# Patient Record
Sex: Male | Born: 1945 | Race: Black or African American | Hispanic: No | Marital: Single
Health system: Southern US, Community
[De-identification: ages and names within clinical notes are randomized; demographics above are authoritative.]

## PROBLEM LIST (undated history)

## (undated) ENCOUNTER — Ambulatory Visit
Attending: Rehabilitative and Restorative Service Providers" | Primary: Rehabilitative and Restorative Service Providers"

## (undated) ENCOUNTER — Encounter

## (undated) ENCOUNTER — Telehealth: Attending: Clinical | Primary: Clinical

## (undated) ENCOUNTER — Telehealth: Attending: Registered" | Primary: Registered"

## (undated) ENCOUNTER — Ambulatory Visit
Payer: MEDICARE | Attending: Student in an Organized Health Care Education/Training Program | Primary: Student in an Organized Health Care Education/Training Program

## (undated) ENCOUNTER — Encounter: Attending: Gastroenterology | Primary: Gastroenterology

## (undated) ENCOUNTER — Telehealth

## (undated) ENCOUNTER — Ambulatory Visit: Payer: MEDICARE

## (undated) ENCOUNTER — Telehealth: Attending: Hematology | Primary: Hematology

## (undated) ENCOUNTER — Encounter
Attending: Student in an Organized Health Care Education/Training Program | Primary: Student in an Organized Health Care Education/Training Program

## (undated) ENCOUNTER — Encounter: Payer: MEDICARE | Attending: Registered" | Primary: Registered"

## (undated) ENCOUNTER — Ambulatory Visit

## (undated) ENCOUNTER — Encounter
Payer: MEDICARE | Attending: Student in an Organized Health Care Education/Training Program | Primary: Student in an Organized Health Care Education/Training Program

## (undated) ENCOUNTER — Encounter: Payer: MEDICARE | Attending: Nurse Practitioner | Primary: Nurse Practitioner

## (undated) ENCOUNTER — Ambulatory Visit: Attending: Clinical | Primary: Clinical

## (undated) ENCOUNTER — Telehealth
Attending: Student in an Organized Health Care Education/Training Program | Primary: Student in an Organized Health Care Education/Training Program

## (undated) ENCOUNTER — Ambulatory Visit: Payer: MEDICARE | Attending: Registered" | Primary: Registered"

## (undated) ENCOUNTER — Encounter: Attending: Anesthesiology | Primary: Anesthesiology

## (undated) ENCOUNTER — Encounter: Payer: MEDICARE | Attending: Medical | Primary: Medical

## (undated) ENCOUNTER — Inpatient Hospital Stay

## (undated) ENCOUNTER — Non-Acute Institutional Stay: Payer: MEDICARE

## (undated) ENCOUNTER — Encounter: Payer: MEDICARE | Attending: Otolaryngology | Primary: Otolaryngology

## (undated) MED ORDER — TOBRAMYCIN 300 MG/5 ML IN 0.225 % SODIUM CHLORIDE FOR NEBULIZATION: 300 mg | mL | Freq: Two times a day (BID) | 0 refills | 28 days | Status: CN

---
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
## 2011-08-22 ENCOUNTER — Emergency Department: Payer: Self-pay | Admitting: Emergency Medicine

## 2012-03-30 ENCOUNTER — Inpatient Hospital Stay: Payer: Self-pay | Admitting: Psychiatry

## 2012-03-30 LAB — URINALYSIS, COMPLETE
Bilirubin,UR: NEGATIVE
Blood: NEGATIVE
Glucose,UR: NEGATIVE mg/dL (ref 0–75)
Hyaline Cast: 3
Leukocyte Esterase: NEGATIVE
Nitrite: NEGATIVE
Ph: 5 (ref 4.5–8.0)
Protein: NEGATIVE
RBC,UR: <1 /HPF (ref 0–5)
Specific Gravity: 1.019 (ref 1.003–1.030)
Squamous Epithelial: <1
WBC UR: 1 /HPF (ref 0–5)

## 2012-03-30 LAB — CBC
HCT: 36 % — ABNORMAL LOW (ref 40.0–52.0)
HGB: 11.8 g/dL — ABNORMAL LOW (ref 13.0–18.0)
MCH: 32 pg (ref 26.0–34.0)
MCV: 97 fL (ref 80–100)
RBC: 3.7 10*6/uL — ABNORMAL LOW (ref 4.40–5.90)
RDW: 15.2 % — ABNORMAL HIGH (ref 11.5–14.5)
WBC: 5.2 10*3/uL (ref 3.8–10.6)

## 2012-03-30 LAB — DRUG SCREEN, URINE
Amphetamines, Ur Screen: NEGATIVE (ref ?–1000)
Cannabinoid 50 Ng, Ur ~~LOC~~: NEGATIVE (ref ?–50)
MDMA (Ecstasy)Ur Screen: NEGATIVE (ref ?–500)
Methadone, Ur Screen: NEGATIVE (ref ?–300)
Opiate, Ur Screen: NEGATIVE (ref ?–300)
Phencyclidine (PCP) Ur S: NEGATIVE (ref ?–25)
Tricyclic, Ur Screen: NEGATIVE (ref ?–1000)

## 2012-03-30 LAB — COMPREHENSIVE METABOLIC PANEL
Bilirubin,Total: 0.4 mg/dL (ref 0.2–1.0)
Creatinine: 0.89 mg/dL (ref 0.60–1.30)
EGFR (African American): >60
Glucose: 82 mg/dL (ref 65–99)
Osmolality: 282 (ref 275–301)
Potassium: 3.6 mmol/L (ref 3.5–5.1)
SGOT(AST): 51 U/L — ABNORMAL HIGH (ref 15–37)
SGPT (ALT): 62 U/L
Sodium: 142 mmol/L (ref 136–145)
Total Protein: 8.9 g/dL — ABNORMAL HIGH (ref 6.4–8.2)

## 2013-08-13 ENCOUNTER — Emergency Department: Payer: Self-pay | Admitting: Emergency Medicine

## 2013-08-13 LAB — COMPREHENSIVE METABOLIC PANEL
Albumin: 3.4 g/dL (ref 3.4–5.0)
Alkaline Phosphatase: 79 U/L (ref 50–136)
Anion Gap: 6 — ABNORMAL LOW (ref 7–16)
BUN: 8 mg/dL (ref 7–18)
Calcium, Total: 9.1 mg/dL (ref 8.5–10.1)
Chloride: 104 mmol/L (ref 98–107)
Co2: 30 mmol/L (ref 21–32)
Creatinine: 1.03 mg/dL (ref 0.60–1.30)
EGFR (African American): >60
EGFR (Non-African Amer.): >60
Glucose: 86 mg/dL (ref 65–99)
Osmolality: 277 (ref 275–301)
SGOT(AST): 37 U/L (ref 15–37)
SGPT (ALT): 43 U/L (ref 12–78)
Sodium: 140 mmol/L (ref 136–145)
Total Protein: 9.1 g/dL — ABNORMAL HIGH (ref 6.4–8.2)

## 2013-08-13 LAB — CBC
HCT: 35.9 % — ABNORMAL LOW
HGB: 12 g/dL — ABNORMAL LOW
MCH: 33.1 pg
MCHC: 33.3 g/dL
MCV: 99 fL
Platelet: 353 x10 3/mm 3
RBC: 3.62 x10 6/mm 3 — ABNORMAL LOW
RDW: 13.6 %
WBC: 6 x10 3/mm 3

## 2013-08-13 LAB — DRUG SCREEN, URINE
Barbiturates, Ur Screen: NEGATIVE (ref ?–200)
Cannabinoid 50 Ng, Ur ~~LOC~~: POSITIVE (ref ?–50)
Cocaine Metabolite,Ur ~~LOC~~: POSITIVE (ref ?–300)
MDMA (Ecstasy)Ur Screen: NEGATIVE (ref ?–500)
Opiate, Ur Screen: POSITIVE (ref ?–300)

## 2013-08-13 LAB — TSH: Thyroid Stimulating Horm: 1.56 u[IU]/mL

## 2014-02-17 ENCOUNTER — Emergency Department: Payer: Self-pay | Admitting: Emergency Medicine

## 2014-02-17 LAB — BASIC METABOLIC PANEL
Anion Gap: 2 — ABNORMAL LOW (ref 7–16)
BUN: 11 mg/dL (ref 7–18)
CREATININE: 0.93 mg/dL (ref 0.60–1.30)
Calcium, Total: 8.6 mg/dL (ref 8.5–10.1)
Chloride: 102 mmol/L (ref 98–107)
Co2: 28 mmol/L (ref 21–32)
EGFR (African American): >60
Glucose: 104 mg/dL — ABNORMAL HIGH (ref 65–99)
Osmolality: 264 (ref 275–301)
Potassium: 4 mmol/L (ref 3.5–5.1)
SODIUM: 132 mmol/L — AB (ref 136–145)

## 2014-02-17 LAB — CBC
HCT: 36 % — AB (ref 40.0–52.0)
HGB: 11.7 g/dL — AB (ref 13.0–18.0)
MCH: 31.7 pg (ref 26.0–34.0)
MCHC: 32.6 g/dL (ref 32.0–36.0)
MCV: 97 fL (ref 80–100)
PLATELETS: 327 10*3/uL (ref 150–440)
RBC: 3.69 10*6/uL — ABNORMAL LOW (ref 4.40–5.90)
RDW: 13.9 % (ref 11.5–14.5)
WBC: 9.6 10*3/uL (ref 3.8–10.6)

## 2014-02-17 LAB — TROPONIN I

## 2014-02-18 LAB — HEPATIC FUNCTION PANEL A (ARMC)
Albumin: 2.8 g/dL — ABNORMAL LOW (ref 3.4–5.0)
Alkaline Phosphatase: 74 U/L
BILIRUBIN DIRECT: 0.1 mg/dL (ref 0.00–0.20)
Bilirubin,Total: 0.6 mg/dL (ref 0.2–1.0)
SGOT(AST): 28 U/L (ref 15–37)
SGPT (ALT): 29 U/L (ref 12–78)
Total Protein: 9.6 g/dL — ABNORMAL HIGH (ref 6.4–8.2)

## 2014-02-18 LAB — TROPONIN I: Troponin-I: <0.02 ng/mL

## 2014-02-18 LAB — LIPASE, BLOOD: Lipase: 124 U/L (ref 73–393)

## 2014-06-22 ENCOUNTER — Emergency Department: Payer: Self-pay | Admitting: Emergency Medicine

## 2014-06-22 LAB — COMPREHENSIVE METABOLIC PANEL
ALK PHOS: 67 U/L
ALT: 16 U/L (ref 12–78)
AST: 21 U/L (ref 15–37)
Albumin: 3 g/dL — ABNORMAL LOW (ref 3.4–5.0)
Anion Gap: 3 — ABNORMAL LOW (ref 7–16)
BILIRUBIN TOTAL: 0.3 mg/dL (ref 0.2–1.0)
BUN: 10 mg/dL (ref 7–18)
CO2: 34 mmol/L — AB (ref 21–32)
CREATININE: 1.03 mg/dL (ref 0.60–1.30)
Calcium, Total: 8.3 mg/dL — ABNORMAL LOW (ref 8.5–10.1)
Chloride: 101 mmol/L (ref 98–107)
EGFR (African American): >60
EGFR (Non-African Amer.): >60
Glucose: 63 mg/dL — ABNORMAL LOW (ref 65–99)
OSMOLALITY: 273 (ref 275–301)
POTASSIUM: 3.6 mmol/L (ref 3.5–5.1)
SODIUM: 138 mmol/L (ref 136–145)
TOTAL PROTEIN: 9 g/dL — AB (ref 6.4–8.2)

## 2014-06-22 LAB — DRUG SCREEN, URINE
AMPHETAMINES, UR SCREEN: NEGATIVE (ref ?–1000)
BARBITURATES, UR SCREEN: NEGATIVE (ref ?–200)
BENZODIAZEPINE, UR SCRN: NEGATIVE (ref ?–200)
CANNABINOID 50 NG, UR ~~LOC~~: POSITIVE (ref ?–50)
Cocaine Metabolite,Ur ~~LOC~~: POSITIVE (ref ?–300)
MDMA (Ecstasy)Ur Screen: NEGATIVE (ref ?–500)
METHADONE, UR SCREEN: NEGATIVE (ref ?–300)
Opiate, Ur Screen: POSITIVE (ref ?–300)
Phencyclidine (PCP) Ur S: NEGATIVE (ref ?–25)
Tricyclic, Ur Screen: NEGATIVE (ref ?–1000)

## 2014-06-22 LAB — URINALYSIS, COMPLETE
BLOOD: NEGATIVE
Bacteria: NONE SEEN
Bilirubin,UR: NEGATIVE
GLUCOSE, UR: NEGATIVE mg/dL (ref 0–75)
Ketone: NEGATIVE
Leukocyte Esterase: NEGATIVE
NITRITE: NEGATIVE
PH: 5 (ref 4.5–8.0)
Protein: 30
RBC,UR: 2 /HPF (ref 0–5)
Specific Gravity: 1.027 (ref 1.003–1.030)
Squamous Epithelial: <1

## 2014-06-22 LAB — CBC
HCT: 35.4 % — AB (ref 40.0–52.0)
HGB: 11.2 g/dL — ABNORMAL LOW (ref 13.0–18.0)
MCH: 31.2 pg (ref 26.0–34.0)
MCHC: 31.7 g/dL — ABNORMAL LOW (ref 32.0–36.0)
MCV: 98 fL (ref 80–100)
Platelet: 291 10*3/uL (ref 150–440)
RBC: 3.6 10*6/uL — ABNORMAL LOW (ref 4.40–5.90)
RDW: 14.6 % — ABNORMAL HIGH (ref 11.5–14.5)
WBC: 5.2 10*3/uL (ref 3.8–10.6)

## 2014-06-22 LAB — SALICYLATE LEVEL

## 2014-06-22 LAB — ETHANOL: Ethanol: <3 mg/dL

## 2014-06-22 LAB — ACETAMINOPHEN LEVEL

## 2014-10-11 ENCOUNTER — Inpatient Hospital Stay: Payer: Self-pay | Admitting: Internal Medicine

## 2014-10-11 LAB — COMPREHENSIVE METABOLIC PANEL
ALBUMIN: 2.7 g/dL — AB (ref 3.4–5.0)
ALK PHOS: 67 U/L
AST: 26 U/L (ref 15–37)
Anion Gap: 2 — ABNORMAL LOW (ref 7–16)
BILIRUBIN TOTAL: 0.4 mg/dL (ref 0.2–1.0)
BUN: 8 mg/dL (ref 7–18)
CHLORIDE: 103 mmol/L (ref 98–107)
CREATININE: 1.03 mg/dL (ref 0.60–1.30)
Calcium, Total: 8.1 mg/dL — ABNORMAL LOW (ref 8.5–10.1)
Co2: 34 mmol/L — ABNORMAL HIGH (ref 21–32)
EGFR (African American): >60
Glucose: 88 mg/dL (ref 65–99)
Osmolality: 275 (ref 275–301)
Potassium: 3.5 mmol/L (ref 3.5–5.1)
SGPT (ALT): 21 U/L
SODIUM: 139 mmol/L (ref 136–145)
Total Protein: 9.9 g/dL — ABNORMAL HIGH (ref 6.4–8.2)

## 2014-10-11 LAB — URINALYSIS, COMPLETE
BACTERIA: NONE SEEN
GLUCOSE, UR: NEGATIVE mg/dL (ref 0–75)
Ketone: NEGATIVE
Leukocyte Esterase: NEGATIVE
Nitrite: NEGATIVE
Ph: 8 (ref 4.5–8.0)
Specific Gravity: 1.03 (ref 1.003–1.030)
Squamous Epithelial: <1
WBC UR: 1 /HPF (ref 0–5)

## 2014-10-11 LAB — INFLUENZA A,B,H1N1 - PCR (ARMC)
H1N1 flu by pcr: NOT DETECTED
Influenza A By PCR: NEGATIVE
Influenza B By PCR: NEGATIVE

## 2014-10-11 LAB — ACETAMINOPHEN LEVEL: Acetaminophen: <2 ug/mL

## 2014-10-11 LAB — CBC
HCT: 35.6 % — AB (ref 40.0–52.0)
HGB: 11.2 g/dL — ABNORMAL LOW (ref 13.0–18.0)
MCH: 29.7 pg (ref 26.0–34.0)
MCHC: 31.3 g/dL — ABNORMAL LOW (ref 32.0–36.0)
MCV: 95 fL (ref 80–100)
Platelet: 437 10*3/uL (ref 150–440)
RBC: 3.75 10*6/uL — AB (ref 4.40–5.90)
RDW: 14.5 % (ref 11.5–14.5)
WBC: 5 10*3/uL (ref 3.8–10.6)

## 2014-10-11 LAB — DRUG SCREEN, URINE
AMPHETAMINES, UR SCREEN: NEGATIVE (ref ?–1000)
BARBITURATES, UR SCREEN: NEGATIVE (ref ?–200)
Benzodiazepine, Ur Scrn: NEGATIVE (ref ?–200)
Cannabinoid 50 Ng, Ur ~~LOC~~: POSITIVE (ref ?–50)
Cocaine Metabolite,Ur ~~LOC~~: NEGATIVE (ref ?–300)
MDMA (ECSTASY) UR SCREEN: NEGATIVE (ref ?–500)
METHADONE, UR SCREEN: NEGATIVE (ref ?–300)
OPIATE, UR SCREEN: POSITIVE (ref ?–300)
PHENCYCLIDINE (PCP) UR S: NEGATIVE (ref ?–25)
Tricyclic, Ur Screen: NEGATIVE (ref ?–1000)

## 2014-10-11 LAB — ETHANOL: Ethanol: <3 mg/dL

## 2014-10-11 LAB — SALICYLATE LEVEL

## 2014-10-14 LAB — BASIC METABOLIC PANEL
ANION GAP: 2 — AB (ref 7–16)
BUN: 18 mg/dL (ref 7–18)
CALCIUM: 7.9 mg/dL — AB (ref 8.5–10.1)
CO2: 34 mmol/L — AB (ref 21–32)
Chloride: 106 mmol/L (ref 98–107)
Creatinine: 1.02 mg/dL (ref 0.60–1.30)
EGFR (African American): >60
EGFR (Non-African Amer.): >60
Glucose: 96 mg/dL (ref 65–99)
Osmolality: 285 (ref 275–301)
Potassium: 4.4 mmol/L (ref 3.5–5.1)
Sodium: 142 mmol/L (ref 136–145)

## 2014-10-14 LAB — CBC WITH DIFFERENTIAL/PLATELET
BASOS PCT: 0.5 %
Basophil #: 0 10*3/uL (ref 0.0–0.1)
EOS ABS: 0 10*3/uL (ref 0.0–0.7)
Eosinophil %: 0.2 %
HCT: 32.7 % — AB (ref 40.0–52.0)
HGB: 10.2 g/dL — ABNORMAL LOW (ref 13.0–18.0)
Lymphocyte #: 1.3 10*3/uL (ref 1.0–3.6)
Lymphocyte %: 12.1 %
MCH: 29.9 pg (ref 26.0–34.0)
MCHC: 31.2 g/dL — ABNORMAL LOW (ref 32.0–36.0)
MCV: 96 fL (ref 80–100)
Monocyte #: 0.6 x10 3/mm (ref 0.2–1.0)
Monocyte %: 5.6 %
Neutrophil #: 9 10*3/uL — ABNORMAL HIGH (ref 1.4–6.5)
Neutrophil %: 81.6 %
Platelet: 345 10*3/uL (ref 150–440)
RBC: 3.42 10*6/uL — ABNORMAL LOW (ref 4.40–5.90)
RDW: 14.7 % — ABNORMAL HIGH (ref 11.5–14.5)
WBC: 11 10*3/uL — AB (ref 3.8–10.6)

## 2014-10-15 LAB — BASIC METABOLIC PANEL
Anion Gap: 1 — ABNORMAL LOW (ref 7–16)
BUN: 15 mg/dL (ref 7–18)
Calcium, Total: 7.8 mg/dL — ABNORMAL LOW (ref 8.5–10.1)
Chloride: 103 mmol/L (ref 98–107)
Co2: 37 mmol/L — ABNORMAL HIGH (ref 21–32)
Creatinine: 0.96 mg/dL (ref 0.60–1.30)
EGFR (African American): >60
EGFR (Non-African Amer.): >60
Glucose: 92 mg/dL (ref 65–99)
OSMOLALITY: 282 (ref 275–301)
POTASSIUM: 4.2 mmol/L (ref 3.5–5.1)
Sodium: 141 mmol/L (ref 136–145)

## 2014-10-16 LAB — CULTURE, BLOOD (SINGLE)

## 2014-10-16 LAB — EXPECTORATED SPUTUM ASSESSMENT W REFEX TO RESP CULTURE

## 2014-10-18 LAB — PLATELET COUNT: Platelet: 354 10*3/uL (ref 150–440)

## 2015-01-08 ENCOUNTER — Inpatient Hospital Stay: Payer: Self-pay | Admitting: Internal Medicine

## 2015-01-08 LAB — TROPONIN I: TROPONIN-I: 0.04 ng/mL

## 2015-01-08 LAB — BASIC METABOLIC PANEL
Anion Gap: 7 (ref 7–16)
BUN: 50 mg/dL — AB (ref 7–18)
CALCIUM: 9 mg/dL (ref 8.5–10.1)
Chloride: 97 mmol/L — ABNORMAL LOW (ref 98–107)
Co2: 30 mmol/L (ref 21–32)
Creatinine: 1.53 mg/dL — ABNORMAL HIGH (ref 0.60–1.30)
GFR CALC AF AMER: 59 — AB
GFR CALC NON AF AMER: 49 — AB
GLUCOSE: 240 mg/dL — AB (ref 65–99)
OSMOLALITY: 289 (ref 275–301)
Potassium: 4.3 mmol/L (ref 3.5–5.1)
Sodium: 134 mmol/L — ABNORMAL LOW (ref 136–145)

## 2015-01-08 LAB — CBC
HCT: 36.9 % — ABNORMAL LOW (ref 40.0–52.0)
HGB: 11.7 g/dL — ABNORMAL LOW (ref 13.0–18.0)
MCH: 30.6 pg (ref 26.0–34.0)
MCHC: 31.7 g/dL — ABNORMAL LOW (ref 32.0–36.0)
MCV: 96 fL (ref 80–100)
Platelet: 413 10*3/uL (ref 150–440)
RBC: 3.83 10*6/uL — ABNORMAL LOW (ref 4.40–5.90)
RDW: 16 % — AB (ref 11.5–14.5)
WBC: 15.1 10*3/uL — ABNORMAL HIGH (ref 3.8–10.6)

## 2015-01-08 LAB — HEPATIC FUNCTION PANEL A (ARMC)
ALBUMIN: 2.5 g/dL — AB (ref 3.4–5.0)
Alkaline Phosphatase: 84 U/L (ref 46–116)
Bilirubin, Direct: 0.3 mg/dL — ABNORMAL HIGH (ref 0.0–0.2)
Bilirubin,Total: 0.9 mg/dL (ref 0.2–1.0)
SGOT(AST): 33 U/L (ref 15–37)
SGPT (ALT): 23 U/L (ref 14–63)
Total Protein: 9.5 g/dL — ABNORMAL HIGH (ref 6.4–8.2)

## 2015-01-09 LAB — CBC WITH DIFFERENTIAL/PLATELET
Basophil #: 0 10*3/uL (ref 0.0–0.1)
Basophil %: 0.1 %
Eosinophil #: 0 10*3/uL (ref 0.0–0.7)
Eosinophil %: 0.1 %
HCT: 33.6 % — AB (ref 40.0–52.0)
HGB: 10.8 g/dL — ABNORMAL LOW (ref 13.0–18.0)
LYMPHS ABS: 0.6 10*3/uL — AB (ref 1.0–3.6)
Lymphocyte %: 3.5 %
MCH: 30.7 pg (ref 26.0–34.0)
MCHC: 32 g/dL (ref 32.0–36.0)
MCV: 96 fL (ref 80–100)
Monocyte #: 0.5 x10 3/mm (ref 0.2–1.0)
Monocyte %: 3 %
NEUTROS ABS: 14.7 10*3/uL — AB (ref 1.4–6.5)
Neutrophil %: 93.3 %
Platelet: 372 10*3/uL (ref 150–440)
RBC: 3.5 10*6/uL — ABNORMAL LOW (ref 4.40–5.90)
RDW: 15.6 % — AB (ref 11.5–14.5)
WBC: 15.7 10*3/uL — AB (ref 3.8–10.6)

## 2015-01-10 LAB — CBC WITH DIFFERENTIAL/PLATELET
Basophil #: 0 10*3/uL (ref 0.0–0.1)
Basophil %: 0 %
Eosinophil #: 0 10*3/uL (ref 0.0–0.7)
Eosinophil %: 0 %
HCT: 30.4 % — ABNORMAL LOW (ref 40.0–52.0)
HGB: 9.7 g/dL — ABNORMAL LOW (ref 13.0–18.0)
Lymphocyte #: 0.7 10*3/uL — ABNORMAL LOW (ref 1.0–3.6)
Lymphocyte %: 3.9 %
MCH: 30.8 pg (ref 26.0–34.0)
MCHC: 31.9 g/dL — ABNORMAL LOW (ref 32.0–36.0)
MCV: 97 fL (ref 80–100)
MONOS PCT: 4.4 %
Monocyte #: 0.8 x10 3/mm (ref 0.2–1.0)
NEUTROS PCT: 91.7 %
Neutrophil #: 15.9 10*3/uL — ABNORMAL HIGH (ref 1.4–6.5)
PLATELETS: 328 10*3/uL (ref 150–440)
RBC: 3.15 10*6/uL — AB (ref 4.40–5.90)
RDW: 15.6 % — ABNORMAL HIGH (ref 11.5–14.5)
WBC: 17.3 10*3/uL — AB (ref 3.8–10.6)

## 2015-01-10 LAB — COMPREHENSIVE METABOLIC PANEL
ALBUMIN: 1.8 g/dL — AB (ref 3.4–5.0)
ALT: 17 U/L (ref 14–63)
AST: 23 U/L (ref 15–37)
Alkaline Phosphatase: 61 U/L (ref 46–116)
Anion Gap: 9 (ref 7–16)
BUN: 22 mg/dL — ABNORMAL HIGH (ref 7–18)
Bilirubin,Total: 0.5 mg/dL (ref 0.2–1.0)
CALCIUM: 7.7 mg/dL — AB (ref 8.5–10.1)
CO2: 25 mmol/L (ref 21–32)
Chloride: 106 mmol/L (ref 98–107)
Creatinine: 0.94 mg/dL (ref 0.60–1.30)
EGFR (African American): >60
Glucose: 131 mg/dL — ABNORMAL HIGH (ref 65–99)
Osmolality: 285 (ref 275–301)
Potassium: 3.7 mmol/L (ref 3.5–5.1)
SODIUM: 140 mmol/L (ref 136–145)
Total Protein: 7 g/dL (ref 6.4–8.2)

## 2015-01-10 LAB — CULTURE, BLOOD (SINGLE)

## 2015-01-10 LAB — CLOSTRIDIUM DIFFICILE(ARMC)

## 2015-01-11 LAB — EXPECTORATED SPUTUM ASSESSMENT W GRAM STAIN, RFLX TO RESP C

## 2015-01-13 ENCOUNTER — Emergency Department: Payer: Self-pay | Admitting: Emergency Medicine

## 2015-01-19 ENCOUNTER — Inpatient Hospital Stay: Payer: Self-pay | Admitting: Psychiatry

## 2015-03-26 NOTE — Consult Note (Signed)
PATIENT NAME:  Zachary Hurley, Zachary Hurley MR#:  [id number] DATE OF BIRTH:  1946-11-12  DATE OF CONSULTATION:  08/14/2013 DATE OF ADMISSION: 08/13/2013.  CONSULTING PHYSICIAN:  Dezerae Freiberger S. Garnetta Buddy, MD  REQUESTING PHYSICIAN: Dr. Cyril Loosen.  REASON FOR CONSULTATION: "I'm just tired."   HISTORY OF PRESENT ILLNESS: The patient is a 69 year old African American male who presented to the ED requesting detox from heroin. He reported that he has been using heroin off and on for 30 to 40 years. He reported that his last use was approximately 3:00 pm yesterday when  he used 4 to 5 bags. The patient stated that he has been consuming $40 to $50 worth  of heroin and has been just tired of using it. He stated that he is not feeling suicidal or homicidal. He stated that he is not hearing any voices or having any thoughts to hurt himself. He sometimes feels depressed and wants help with his heroin use. The patient reported that he has been spending most of the time in the bed with lack of energy, anhedonia, feeling hopeless, and now he needs help with his heroin use. The patient reported that currently he is experiencing withdrawal symptoms, feeling achy, jittery with poor sleep and appetite is fair. The patient stated that he wants to go to drug rehab program so he can quit using heroin at this time.   PAST PSYCHIATRIC HISTORY: The patient reported that he started using heroin when he was in his 54's.  His drug of choice is for heroin. He reported that he has also used pills including Percocet and a lot of downers in the past. He stated that he has been to several other hospitals in the past including [facility],  [facility], as well as [facility]. He stated that he remained sober for 2 to 3 years, but then relapsed again, but does not know the reason for the relapse. He stated that he follows with his family care physician, Dr. Maryellen Pile. He does not have any history of previous suicide attempts in the past.   PAST MEDICAL HISTORY: The patient  reported that he has been diagnosed with HIV and has been following with [facility]. He is compliant with his medication. He reported that his labs are undetectable at this time.   CURRENT HOME MEDICATIONS: Kaletra 200/50 mg 2 tablets b.i.d., Truvada 200/300 mg 1 tablet once a day.   ALLERGIES: No known drug allergies.   FAMILY HISTORY: The patient denies any history of drug problems.   SOCIAL HISTORY: The patient reported that he is currently getting SSI and supports himself on the same. He reported that he has been in the prison for at least 15 to 20 times in the past. He used to sell cocaine to support himself. He is not on probation at this time. He reported that he was in the prison last time 9 years ago. He is not on probation at this time. The patient stated that  he is currently living with his sister. He has never been married but has two sons and has a good  relationship with them.   PHYSICAL EXAMINATION:  VITAL SIGNS: Temperature 98.1, pulse 100, respirations 18, blood pressure 128/65.   LABORATORY DATA:  Glucose 86, BUN 8, creatinine 1.03, sodium 140, potassium 3.5 , chloride 104, bicarbonate 30, anion gap 6, osmolality 277, calcium 9.1. Blood alcohol less than 3. Protein 9.1, bilirubin 0.4, alkaline phosphatase 99, AST 37, ALT 43, TSH 1.56. UDS positive for cocaine, cannabinoids and opioids. WBC  6.0, RBC 3.62, hemoglobin 12, hematocrit 35.9., MCV 99.    REVIEW OF SYSTEMS:  CONSTITUTIONAL: Denies any fever, weakness, having some fatigue.  EYES: No blurred or double vision.  ENT: Denies any tinnitus, ear pain, hearing loss.  RESPIRATORY: No cough, wheezing or hemoptysis noted.  CARDIOVASCULAR: Denies any chest pain or orthopnea, edema or arrhythmias. GASTROINTESTINAL: Having some nausea and abdominal pain.  GENITOURINARY: No dysuria or hematuria noted.  HEMATOLOGY: No anemia or easy bruising noted.  INTEGUMENTARY: No acne or rash noted.  MUSCULOSKELETAL: Having some pain in  the body and some cramps present.  NEUROLOGICAL: Denies any numbness weakness at this time.   MENTAL STATUS EXAMINATION: The patient is a thinly built male who was lying in the bed. He maintained fair eye contact. His speech was low in tone and volume. Mood was depressed. Affect was congruent. Thought process was logical, goal-directed. Thought content was nondelusional. He currently denied having any suicidal or homicidal ideations or plans. He is currently experiencing withdrawal from the drugs. He agreed with the plan to go to [facility]. He demonstrated fair insight and judgment regarding his drug use. He denied having any perceptual disturbances.   DIAGNOSTIC IMPRESSION: AXIS I: 1. Polysubstance dependence.  2. Opioid withdrawal.  AXIS II: None.  AXIS III: Human immunodeficiency virus.   TREATMENT PLAN: The patient has a bed at [facility] and he will be transferred there shortly. He currently denied having any suicidal ideation or plans.   Thank you for allowing me to participate in the care of this patient.   ____________________________ Ardeen FillersUzma S. Garnetta BuddyFaheem, MD usf:sg D: 08/14/2013 11:12:00 ET T: 08/14/2013 12:00:24 ET JOB#: [id number]  cc: Ardeen FillersUzma S. Garnetta BuddyFaheem, MD, <Dictator> Rhunette CroftUZMA S Edilia Ghuman MD ELECTRONICALLY SIGNED 08/26/2013 8:59

## 2015-03-27 NOTE — H&P (Signed)
PATIENT NAME:  Zachary Hurley, Zachary Hurley MR#:  [id number] DATE OF BIRTH:  22-Feb-1946  DATE OF ADMISSION:  10/11/2014  PRIMARY CARE PHYSICIAN: None.  INFECTIOUS DISEASE: The patient was going to [facility].   CHIEF COMPLAINT: Hemoptysis.   HISTORY OF PRESENT ILLNESS: The patient to the ER. thinks he has pneumonia and has suicidal ideation along with symptoms of hemoptysis. [name] is a 69 year old African American gentleman with long-standing history of HIV, IV heroine dependence along with polysubstance street drug use who comes to the Emergency Room with complaints of hemoptysis. The patient says he has been noticing streaks of blood in his phlegm for the last few days. Denies any fever, has poor appetite and some sweats at nighttime. He is basically homeless at this time, does not have any place, however, tries to go from one shelter home to another. In the Emergency Room, he has hemodynamically stable; however, his chest x-ray shows worrisome for increased interstitial opacities, left greater than the right; hence, he is being admitted for possible pneumonia in the setting of HIV. He received IV Rocephin and Levaquin. The patient denies any history of tuberculosis or any exposure to TB; however, being homeless he is going to be placed in isolation and AFB sputum has been attempted to be collected.   The patient has long-standing history of heroin dependence. He uses about 3-4 bags of IV heroin on a daily basis. His last use was this morning. He has been at [facility] and [facility] for his heroin dependence; however, he relapses and gets back to his substance abuse.   PAST MEDICAL HISTORY:  1.  HIV.  2.  IV heroine dependence.   SOCIAL HISTORY: He has been on and off in the prison for at least 15-20 times because of his abuse. He is never married. Denies any history of smoking or alcohol use; however, he does use cocaine and mainly heroin on a daily basis.   ALLERGIES: No known drug allergies.   FAMILY  HISTORY: Positive for heart problems in distant family relatives. He does not know much about his family history.   REVIEW OF SYSTEMS: CONSTITUTIONAL: No fever. Positive for fatigue, weakness.  EYES: No blurred or double vision. No cataracts.  EARS, NOSE AND THROAT: No tinnitus, ear pain or postnasal drip.  RESPIRATORY: Positive for hemoptysis and dyspnea.  CARDIOVASCULAR: No chest pain, orthopnea, edema or hypertension.  GASTROINTESTINAL: No nausea, vomiting, diarrhea, abdominal pain or hematemesis.  GENITOURINARY: No dysuria, hematuria, frequency.  ENDOCRINE: No polyuria, nocturia or thyroid problems.  HEMATOLOGY: No anemia or easy bruising.  SKIN: No acne, rash.  MUSCULOSKELETAL: Positive for back pain. No arthritis, swelling or gout.  NEUROLOGIC: No CVA, TIA or seizure.  PSYCHIATRIC: Positive for feeling depressed. No anxiety or bipolar. All other systems reviewed and negative.   PHYSICAL EXAMINATION: GENERAL: The patient is thin, cachectic, malnourished.  VITAL SIGNS: Afebrile, pulse is 58, blood pressure is 142/93, pulse oximetry is 99.  HEENT: Atraumatic, normocephalic. PERRLA. EOM intact. Oral mucosa is dry.  NECK: Supple. No JVD. No carotid bruit.  LUNGS: There are a few crackles heard. No respiratory distress or labored breathing. Saturations are more than 97% on room air.  CARDIOVASCULAR: Both the heart sounds are normal. No murmur heard. PMI not lateralized. Chest nontender.  EXTREMITIES: Good pedal pulses, good femoral pulses. No lower extremity edema.  ABDOMEN: Soft, benign, scaphoid abdomen. No tenderness. No organomegaly.  SKIN: Warm and dry. No lesions seen.  NEUROLOGIC: Appears grossly intact, cranial nerves II-XII,  no motor or sensory deficit.  PSYCHIATRIC: The patient is awake, alert, oriented x 3.   LABORATORY AND RADIOLOGICAL DATA: Chest x-ray shows worsening bibasilar interstitial opacities, left greater than the right, possibly progressive atelectasis or scar,  though worrisome for multifocal infection superimposed on advanced emphysematous changes. Tricyclic antidepressant negative in the urine. White count is 5.0, H and H is 11.2 and 35.6, platelet count is 437,000. Comprehensive metabolic panel is within normal limits except CO2 of 34. Albumin is 2.7. Serum salicylate, serum methanol and serum acetaminophen levels within normal limits.   ASSESSMENT: A 69 year old Kennis CarinaMarion Lyttle with history of HIV and intravenous drug dependence, comes in with:  1.  Hemoptysis in the setting of HIV along with emphysema, emphysematous lung changes. The patient is going to be admitted in the isolation room. He will be continued on Levaquin. We will add Bactrim for now for pneumocystis pneumonia/pneumonitis. I will have pulmonary and infectious disease see the patient. AFB sputum to be collected along with sputum culture. Follow blood cultures. The patient's white count is normal. Further workup per ID and pulmonary consultation. Watch for massive hemoptysis.  2.  Intravenous heroin drug dependence along with history of cocaine drug abuse in the past. The patient normally steals drugs off the street and injects on a daily basis 3-4 bags of IV heroine. His last use was this morning. I will have psychiatry see the patient in the morning to determine treatment plan if any at this time. He has been referred to [facility] and [facility] in the past by psychiatry during his visits to the Emergency Room. Watch for heroin withdrawal.  3.  HIV. The patient has been off medications due to compliance issues. [facility]. He was on Truvada and Kaletra. I will have Dr. Sampson GoonFitzgerald look into it and determine which anti-HIV medications he needs to be on.  4.  Deep venous thrombosis prophylaxis. Subcutaneous heparin.  5.  Moderate to severe protein/calorie malnutrition secondary to chronic illness from HIV, poor p.o. intake and social living situation. We will have dietician see the  patient in consultation.  Further workup according to the patient's clinical course. Hospital admission plan was discussed with the patient, no family members present.   TIME SPENT: Fifty minutes.    ____________________________ Wylie HailSona A. Allena KatzPatel, MD sap:TT D: 10/11/2014 16:48:16 ET T: 10/11/2014 17:05:13 ET JOB#: [id number]  cc: Erienne Spelman A. Allena KatzPatel, MD, <Dictator> Willow OraSONA A Ahmiya Abee MD ELECTRONICALLY SIGNED 11/05/2014 14:05

## 2015-03-27 NOTE — Discharge Summary (Signed)
PATIENT NAME:  Zachary Hurley, Zachary Hurley MR#:  [id number] DATE OF BIRTH:  March 11, 1946  DATE OF ADMISSION:  10/11/2014 DATE OF DISCHARGE:  10/18/2014  ADMITTING PHYSICIAN: Sona A. Allena Katz, MD.  DISCHARGING PHYSICIAN: Enid Baas, MD.  PRIMARY CARE PHYSICIAN: [facility].  CONSULTATIONS IN THE HOSPITAL:  1. Pulmonary-critical care consultation with Dr. Carolynne Edouard, MD. 2. Infectious disease consultation with Stann Mainland. Sampson Goon, MD. 3. Psychiatric consultation with Audery Amel, MD.  DISCHARGE DIAGNOSES:  1. Haemophilus influenza pneumonia. 2. Human immunodeficiency virus with CD4 count of 491. 3. Chronic bronchiectasis. 4. Depression. 5. Steroid dependence.   DISCHARGE HOME MEDICATIONS:  1. Truvada 200/300 mg 1 tablet p.o. q. daily. 2. Kaletra 200/50 mg 2 tablets orally twice a day. 3. Amoxicillin 500 mg p.o. q. 8 hours for 8 days.  4. Ciprofloxacin 500 mg p.o. b.i.d. for 8 more days.  5. Albuterol inhaler 2 puffs q. 6 hours. p.r.n. for wheezing.  DISCHARGE DIET: Regular diet.  DISCHARGE ACTIVITY: As tolerated.  FOLLOWUP INSTRUCTIONS: Follow up with [facility] in 1 week.   LABORATORY AND IMAGING STUDIES PRIOR TO DISCHARGE: Sodium 141, potassium 4.2, chloride 103, bicarbonate 37, BUN 15, creatinine 0.96, glucose 92, and calcium of 7.8. WBC 11, hemoglobin 10.2, hematocrit 32.7, platelet count 345,000. HIV RNA is undetectable at this time, less than 20 copies per milliliter. AFB x 3 is negative. CD4 count is 496. Sputum culture is growing heavy growth Haemophilus influenza. CT of the chest done on admission with contrast showing lower zone predominant bronchiectasis and peri-bronchovascular airspace disease. Could be infectious process, atypical or fungal infection. Coronary artery calcifications. Tiny bilateral pleural effusions and bilateral adrenal lesions noted. Influenza test is negative. Blood cultures negative on admission. Urine toxicology screen positive for  marijuana and also opioids, and admission urinalysis negative for infectious. ALT 21, AST 26, alkaline phosphatase 67, total bilirubin 0.4 and albumin of 2.7 noted.  BRIEF HOSPITAL COURSE: Mr. Uphoff is a 69 year old African-American male with a past medical history significant for HIV infection noncompliant with his medications and heroin dependence, who presents to the hospital secondary to coughing, difficulty breathing, and also hemoptysis.  1. Haemophilus influenza pneumonia, initially admitted and was placed on isolation because of his underlying HIV. Also looking at the CT chest, they want to rule out AFB. His sputum x 3 were negative for acid-fast bacilli stains, so taken out of droplet isolation. There was a concern for Pneumocystis as well as opportunistic infection. However, CD4 count was greater than 496, so less likely to be Pneumocystis and his sputum cultures were growing H. influenza. He was initially started on broad-spectrum antibiotics. Once sputum cultures were available, he is being changed over to oral amoxicillin and ciprofloxacin for a total of 10 days. During initial admission process, he was also seen by pulmonologist, Dr. Welton Flakes, to evaluate if the patient needs any bronchoscopy for Pneumocystis diagnosis. Since he was improving with antibiotics and sputum cultures were positive for H. influenza, no further need for bronchoscopy at this time. The patient has underlying chronic bronchiectasis. He has improved significantly while in the hospital and is being discharged home. 2.  HIV. CD4 count is 496. HIV RNA was undetectable. The patient was on Kaletra and Truvada in the past but has been noncompliant recently. He was restarted back on that and is strongly recommended to follow up with [facility] for the same.  3. Episode of unconsciousness in the hospital. Not sure if his friends brought anymore heroin in  the hospital. He is extremely heroin dependent. He usually takes about 4 to 5  bags of heroin as an outpatient . Started on methadone withdrawal and has finished withdrawal in the hospital. After the unresponsive episode, all the opioids were stopped and he responded to Narcan and was monitored in ICU for 1 days. Did not require any intubation at that point. He is more alert and back to his baseline at this time. He is strongly counseled against using heroin again.  His course has been otherwise uneventful in the hospital.   DISCHARGE CONDITION: Stable.   DISCHARGE DISPOSITION: Home.  TIME SPENT ON DISCHARGE: 40 minutes.   ____________________________ Enid Baasadhika Adonis Yim, MD rk:jh D: 10/18/2014 10:49:31 ET T: 10/18/2014 17:28:39 ET JOB#: [id number]  cc: Enid Baasadhika Amaree Leeper, MD, <Dictator> Enid BaasADHIKA Shuree Brossart MD ELECTRONICALLY SIGNED 10/24/2014 15:05

## 2015-03-27 NOTE — Consult Note (Signed)
PATIENT NAME:  Zachary Hurley, Zachary Hurley MR#:  [id number] DATE OF BIRTH:  04-08-1946  DATE OF CONSULTATION:  10/12/2014  REFERRING PHYSICIAN:   CONSULTING PHYSICIAN:  Stann Mainlandavid P. Sampson GoonFitzgerald, MD  REQUESTING PHYSICIAN:  Dr. Nemiah CommanderKalisetti.     REASON FOR CONSULTATION: Hemoptysis in an HIV patient.   HISTORY OF PRESENT ILLNESS: This is a 69 year old gentleman with long-standing HIV as well as polysubstance abuse, homelessness, and psychiatric history who was admitted November 8 with hemoptysis. He had also been having suicidal ideation. He reports now he has been coughing for several weeks and has had some streaks of blood in it for the last few days. He denies any fevers or night sweats. He is very thin, but reports he has always been like that and has not lost any weight.    The patient had a chest x-ray and CT showing probable pneumonia so he was admitted. The patient reports he is not 100% compliant with his HIV medications. [facility] but has not been seen in some months. He cannot name his antiretrovirals. He does continue to use heroin. He had been a heavy smoker but is no longer smoking.   PAST MEDICAL HISTORY:  1.  HIV.  2.  Heroin dependence.  3.  COPD.    SOCIAL HISTORY: He is single. He quit smoking, but did used to smoke heavily. Denies alcohol. Does use cocaine and heroin on a daily basis. He has been in prison several times.   ALLERGIES: No known drug allergies.   FAMILY HISTORY: Noncontributory.   REVIEW OF SYSTEMS:  11 systems reviewed and negative except as per HPI.   PHYSICAL EXAMINATION:  VITAL SIGNS: Temperature 98.4, pulse 60, blood pressure 115/69, respirations 18, saturation 98% on room air.  GENERAL: He is quite thin. No respiratory distress.  HEENT: Pupils equal, round, reactive to light and accommodation. Extraocular movements are intact. Sclerae are anicteric.  OROPHARYNX: Clear with no thrush.  NECK: Supple. He has no anterior cervical, posterior cervical, or  supraclavicular lymphadenopathy.  HEART: Regular.  LUNGS: Have decreased breath sounds bilaterally.  ABDOMEN: Soft and distended. No hepatosplenomegaly.  EXTREMITIES: No clubbing, cyanosis, or edema.  NEUROLOGIC: He is alert and oriented x 3. Grossly nonfocal neurologic exam.   DIAGNOSTIC DATA: White count 5.0, hemoglobin 11.2, platelets 437,000. Renal function is normal. LFTs show an elevated total protein 9.9, albumin low at 2.7, otherwise LFTs normal. Ethanol level negative. Toxicology screen positive for opiates and marijuana. Blood cultures x 2 negative. Sputum culture being held for possible pathogen. Influenza PCR negative. CT of the chest shows no pathologically enlarged lymph nodes. There are tiny bilateral effusions, there is moderate to severe central lobular emphysema with minimal bullous changes in the apices. There is bronchiectasis in the right middle lobe, lingula, and both lower lobes with associated mucoid impaction and perivascular airspace opacity, debris was seen in the left main stem bronchus. There is a 6 mm low attenuating lesion in the right hepatic lobe and in the kidneys bilaterally. CD4 count is pending.   IMPRESSION: A 69 year old gentleman with long-standing HIV and heroin use, admitted with cough for several weeks with hemoptysis. Sputum culture is pending. He is not very compliant with his HIV meds and his CD4 count is pending.   RECOMMENDATIONS:  1.  Check sputum x 3 and continue isolation until negative.  2.  Continue ceftriaxone and levofloxacin for now.  3.  Continue Bactrim prophylactic dosing.  4.  I will call [facility] to find out  what his most recent CD4 viral loads were and what his antiretroviral regimen is.  5.  Thank you for the consult. I will be glad to follow with you.     ____________________________ Stann Mainland. Sampson Goon, MD dpf:bu D: 10/12/2014 14:15:05 ET T: 10/12/2014 14:29:49 ET JOB#: [id number]  cc: Stann Mainland. Sampson Goon, MD, <Dictator> DAVID  Sampson Goon MD ELECTRONICALLY SIGNED 10/13/2014 17:38

## 2015-03-27 NOTE — Consult Note (Signed)
Brief Consult Note: Diagnosis: opiate abuse severe.   Patient was seen by consultant.   Consult note dictated.   Orders entered.   Comments: Psychiatry: PAtient seen and chart reviewed and note dictated. Patient with opiate abuse severe. Denies any wish to harm or kill himself. Agrees to outpt referral. Commitment discontinued.  Electronic Signatures: Audery Amellapacs, John T (MD)  (Signed [id number]-Nov-15 15:13)  Authored: Brief Consult Note   Last Updated: 09-Nov-15 15:13 by Audery Amellapacs, John T (MD)

## 2015-03-27 NOTE — Consult Note (Signed)
Psychiatry: PAtient seen. Chart reviewed. PAtient reports he is feeling better. Mood still a little down but better. Denies any suicidal ideation. Affect euthymic. Speach normal in rate and tone. Thoughts lucid. Energy seems improved. Discussed treatment of depression. PAtient feels something to help sleep at night would help. He has never really stayed on medication for depression long enough before to know if it works. Will add remeron 15mg  at night for sleep, mood and depression. Patient agreeable.  Electronic Signatures: Velda Wendt, Jackquline DenmarkJohn T (MD)  (Signed on 10-Nov-15 21:43)  Authored  Last Updated: 10-Nov-15 21:43 by Audery Amellapacs, Arlean Thies T (MD)

## 2015-03-27 NOTE — Consult Note (Signed)
**Note Zachary Hurley-Identified via Obfuscation** PATIENT NAME:  Zachary Hurley, Zachary Hurley MR#:  [id number] DATE OF BIRTH:  February 25, 1946  DATE OF CONSULTATION:  10/12/2014  CONSULTING PHYSICIAN:  Gonzella Lex, MD  IDENTIFYING INFORMATION AND REASON FOR CONSULTATION:  This is a 69 year old man with a history of opiate abuse, who came in to the hospital with pneumonia and suicidal ideation, consult for suicidal ideation.   HISTORY OF PRESENT ILLNESS:  Information is obtained from the patient and the chart. He came in to the hospital coughing, feeling sick, but also made some comments about suicidality. On interview today, the patient tells me that he has been feeling bad for a couple of weeks. He had relapsed back into heroin use and has been using intravenous heroin probably for months. He has no current stable place to stay. He just stays from place to place where he can. His mood stays down. He gets down on himself a lot. He feels a lot of regrets. He sleeps poorly at night. Appetite has been poor, and he has been losing weight. He is generally noncompliant with recommended treatment of his HIV disease. He denies that he is drinking. He says that he uses a little marijuana occasionally, but heroin is by far the most important drug to him. He denies that he has made any suicide attempts.   PAST PSYCHIATRIC HISTORY:  The patient has a history of getting down on himself and being depressed. At times, he has been prescribed antidepressants previously. In the past, he has not found antidepressants to really be helpful. Mood is good if he can get his life stable and stay off of substances. He has been to [facility] in the past. He is familiar with substance abuse treatment resources.   SOCIAL HISTORY:  He apparently is estranged from most of his family. He says that there is a woman who he can go back to live with, but seems to have a pretty impaired social setting.   FAMILY HISTORY:  Denies any family history.   PAST MEDICAL HISTORY:  He has HIV and tends to be fairly  uncooperative with his medication. Currently in the hospital with hemoptysis, unclear if it could be AF[employer].   CURRENT MEDICATIONS:  On admission, it seems like he was probably not taking anything.   ALLERGIES:  No known drug allergies.   REVIEW OF SYSTEMS:  Mildly down on himself. Mildly depressed. Weak. Lungs are starting to feel better, breathing better. No suicidal ideation. No hallucinations.   MENTAL STATUS EXAMINATION:  Chronically ill gentleman who looks his stated age, cooperative with the interview. Eye contact is good. Psychomotor activity is a little bit slow. Speech is a little bit slow, but easy to understand. Affect is constricted. Mood is stated as being okay. Thoughts are lucid without any loosening of associations or evidence of delusions. Denies auditory or visual hallucinations. Denies suicidal or homicidal ideation. He can recall 3 out of 3 objects immediately, only 1 out of 3 at 3 minutes and complains of chronic memory problems. Alert and oriented x 4. Normal intelligence.   ASSESSMENT:  A 69 year old man with opiate dependence. He is not acutely suicidal. Commitment criteria no longer met. He can be taken off commitment. He can be discharged when medically ready. We will encourage him to follow up with outpatient substance abuse treatment. No indication at this point for changing any medication.   DIAGNOSIS, PRINCIPAL AND PRIMARY:  AXIS I:  Opiate abuse, severe.   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   AXIS II:  Deferred.   AXIS III:  HIV disease.   ____________________________ Gonzella Lex, MD jtc:nb D: 10/12/2014 22:01:41 ET T: 10/12/2014 22:18:09 ET JOB#: [id number]  cc: Gonzella Lex, MD, <Dictator> Gonzella Lex MD ELECTRONICALLY SIGNED 10/19/2014 17:04

## 2015-03-27 NOTE — Consult Note (Signed)
PATIENT NAME:  Zachary Hurley, EGGENBERGER MR#:  [id number] DATE OF BIRTH:  1946-11-18  DATE OF CONSULTATION:  06/23/2014  REFERRING PHYSICIAN:  Dr. Jene Every. CONSULTING PHYSICIAN:  Tavarius Grewe S. Garnetta Buddy, MD  REASON FOR CONSULTATION: " I want detox from heroin and pills."   HISTORY OF PRESENT ILLNESS: The patient is a 69 year old African American male with long history of substance dependence, who presented to the ER as he was requesting detoxification from heroin, pills, and cocaine. He reported that he wants detoxification and wants to stop using drugs. He denied having any suicidal ideations or plans. He reported that he is not having any perceptual disturbances.   During my interview, the patient was lying in the bed. He reported that he has been using opioids, heroin, Percocet, and methadone. He reported that he cannot do like this anymore. He stated that he has been using 3 bags of heroin and used them yesterday. When he cannot get dope, then he started using pills. He has been using 3-4 pills of Percocet, as well as 2-3 pills of methadone. He has been doing it off and on for the last 40 years. Last time when he came to the hospital, he was sent to [facility] and he completed a 21 day program. He reported that he was able to maintain sobriety; however, now he wants to go to [facility], as he was there 20 years ago. He reported that he does not have any thoughts to hurt himself; however, he occasionally feels depressed. He currently denied having any auditory or visual hallucinations. He denied having any thoughts to hurt himself.   PAST PSYCHIATRIC HISTORY: The patient has long history of using drugs, including opioids. He reported that he has used cocaine in the past. He stated that he was admitted to the [facility] in the past. He has never tried to hurt himself.   PAST MEDICAL HISTORY: The patient is HIV-positive and takes medication for the same. He stated that he is compliant with his medication and will follow up at  the [facility] on a regular basis. He stated that he is undetectable at this time.   SOCIAL HISTORY: The patient reported that he lives between his sister and the streets. He came from the street at this time and called for help as he was tired of living this life.   CURRENT MEDICATIONS: Kaletra 200/50 mg 2 tablets b.i.d. and Truvada 200/300 mg 1 tablet once daily.   ALLERGIES: NO KNOWN DRUG ALLERGIES.   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  He is also being supported by the checked from [employer].   VITAL SIGNS: Temperature 96, pulse 75, respirations 18, blood pressure 100/59.   REVIEW OF SYSTEMS:  CONSTITUTIONAL: No fever or chills.  The patient is a cachectic-appearing male lying in the bed.  EYES: No double or blurred vision.  RESPIRATORY: No shortness of breath or cough.  CARDIOVASCULAR: No chest pain or orthopnea.  GASTROINTESTINAL: No abdominal pain, nausea, vomiting, or diarrhea.  GENITOURINARY: No incontinence or bleeding.  ENDOCRINE: No heat or cold intolerance.  LYMPHATIC: No anemia or easy bruising.  MUSCULOSKELETAL: No muscle or joint pain.  NEUROLOGIC: No tingling or weakness.  LABORATORY DATA: Glucose 63, BUN 10, creatinine 1.03, sodium 138, potassium 2.6, chloride 101, bicarbonate 34, anion gap 3, osmolality 273, calcium 8.3. Blood alcohol level less than 3. Protein 9.0, albumin 3, bilirubin 0.3, alkaline phosphatase 67, AST 21, ALT 16. UDS positive for cocaine, cannabinoids, and opioids.  RBC 3.6, hemoglobin 11.2, hematocrit 35.4, RDW is 14.6.   MENTAL STATUS EXAMINATION:  General appearance, the patient is a cachectic-appearing  male who was lying still in the bed. He appears malnourished.  His gait and station was not tested as he was lying still in the bed. His speech was low in tone and volume. Mood was depressed. Affect was blunted. Thought process was logical, goal-directed. Thought content was non-delusional. No loose associations are  noted. He currently denied having any suicidal or homicidal ideations or plans. His insight and judgment were poor regarding his use of the opioids at this time. He was awake, alert, and oriented x 3. His recent and remote memory was intact. His attention span and concentration were normal. His language and fund of knowledge were appropriate.   DIAGNOSTIC IMPRESSION:  AXIS I:  1.  Opioid dependence.  2.  Opioid-induced mood disorder.  AXIS II: None.  AXIS III: Human immunodeficiency virus-positive.   TREATMENT PLAN: Discussed with the patient at length about use of drugs and advised him to be admitted to the [facility] again to get help about the same. However, he is resistant and does not want to go to the [facility] as reported.  It is too far for him. He stated that he wants to go to [facility]. Stated that he will be referred to the behavioral health unit and they will discuss with him about the same. The patient will be started on his HIV medications at this time.   Thank you for allowing me to participate in the care of this patient.    ____________________________ Ardeen FillersUzma S. Garnetta BuddyFaheem, MD usf:ts D: 06/23/2014 16:15:06 ET T: 06/23/2014 16:43:53 ET JOB#: [id number]  cc: Ardeen FillersUzma S. Garnetta BuddyFaheem, MD, <Dictator> Rhunette CroftUZMA S Elwyn Klosinski MD ELECTRONICALLY SIGNED 07/02/2014 21:22

## 2015-03-28 NOTE — H&P (Signed)
PATIENT NAME:  Zachary CarinaWARREN, Samier MR#:  [id number] DATE OF BIRTH:  [birth year]  DATE OF ADMISSION:  03/30/2012  INITIAL ASSESSMENT AND PSYCHIATRIC EVALUATION  IDENTIFYING INFORMATION: The patient is a 69 year old African American male not employed and last worked many years ago. The patient is single, never married and has been living here and there and most of the time with this two sisters. The patient comes as inpatient hospitalization in psychiatry at [facility] with the chief complaint, "I need to get off my addiction with heroin".  HISTORY OF PRESENT ILLNESS:  The patient reports that he has been addicted to heroin IV since the early 70s. He has been taking IV method of heroin since then. He takes as much as he can get and as often as he can get and depends on the money. He last took it this a.m. prior to his admission here. The patient decided by himself that he is going nowhere and he has not achieved anything and that it is time for him to quit taking heroin.   PAST PSYCHIATRIC HISTORY: History of inpatient hospitalization in psychiatry once before many years ago to [facility] for heroin addiction. He stayed sober for two weeks after discharge and started using it again and he reports, "for no reason. I just get addicted". Currently, the patient reports he wants to take help from detox and then be [facility] and he wants to continue to take treatment from that sector. No history of suicide attempts. Not being followed by any psychiatrist.   FAMILY HISTORY OF MENTAL ILLNESS: Not known for mental illness. No known history of suicides in the family.   FAMILY HISTORY:  Raised by single parent who is a father. Mother just left. Mother was there off and on. Father worked for the city in the Research officer, political [employer]ysanitation department.  Father died of kidney failure at 4182 years. Mother is living and in her 8380s. He is in touch with mother  "every now and then". He has five sisters and one brother, close to family.   PERSONAL HISTORY: [place] He dropped out in tenth grade because "just quit". No G.E.D.    WORK HISTORY: First job at [employer] at age 70 years. This job lasted for a few months. Quit for no reason. 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 Last worked many years ago. When the patient was asked how he is able to survive and live without a job for many years, he reported, "selling drugs and dealing with drugs".  MARRIAGES: Never married. Dated a little. He has two children, a 332 year old and a 67 year old. They are both sons and close to them "sometimes".   ALCOHOL AND DRUGS: First drink of alcohol "many years ago". Denies any problems with alcohol drinking. No history of DWIs. Never arrested for public drunkenness. Does admit smoking THC occasionally. Denies any problems with crack cocaine. Does admit that he started using heroin on an IV basis in the early 70s and has been a problem since then. Uses as much as he can get and last used this a.m. He has been through a program once and could stay sober for two weeks only. Denied smoking nicotine cigarettes.  MEDICAL HISTORY: He has labile hypertension. No known diabetes mellitus. No major surgeries. No major injuries. No history of motor vehicle accident. Never been unconscious. He  was diagnosed with human immunodeficiency virus positive about 20 years ago, being followed by Infectious Department at [facility]. Last appointment was sometime ago. Next appointment was to be made. He is on medications for human immunodeficiency virus positive and takes them as prescribed.   PHYSICAL EXAMINATION:   VITAL SIGNS: Temperature 98.2, pulse 62 per minute, regular, respirations 18 per minute, regular, blood pressure 130/80.   HEENT: Head is normocephalic, atraumatic. EYES: Pupils are equal, round and reactive to light and  accommodation. Fundi bilaterally benign. Extraocular movements "intact."  NECK: Supple without any organomegaly, lymphadenopathy or thyromegaly.  CHEST: Normal expansion. Normal breath sounds heard.   HEART: Normal S1, S2 without any murmurs or gallops.   ABDOMEN: Soft. No organomegaly. Bowel sounds heard.   RECTAL: Deferred.   SKIN: Normal turgor. No rash, warm and dry. Has marks from IV injections of heroin.   EXTREMITIES: Nontender. Normal range of motion. He has several IV tracks on both arms. This is from heroin usage by IV method.   NEUROLOGIC: Gait is normal. Romberg is negative. Cranial nerves II through XII grossly intact. Deep tendon reflexes 2+. Plantars normal response.   MENTAL STATUS EXAMINATION: The patient is dressed in hospital pajamas, alert and oriented, aware of the situation, brought in for admission to [facility]. He knew the date and time with little prompting and help. He knew the capital of [place] and capital of the [place] with a little prompting and help. He could name the current president, but not the previous president. Affect is appropriate. Affect is neutral. Mood stable. Denies feeling depressed. Denies feeling hopeless or helpless. Denies feeling worthless or useless. Denies suicidal or homicidal plans and stated, "I just want help from my problems. I feel upset and angry that I am going nowhere in my life because of this heroin and I want to get away". No evidence of psychosis. Denies auditory or visual hallucinations. Denies hearing voices or seeing things. Denies paranoid or suspicious ideas. Denies thought control. Denies having any grandiose ideas or having any unusual power. He could spell the word world forward, could not spell it backward. Memory and recall are fair and good. For fire, he said he would leave. For an envelope, he would mail it. Does admit to sleep disturbance and not able to get a good night rest. Appetite  is fair when he can get around food. Insight and judgment guarded.   IMPRESSION: AXIS I: 1. Heroin addiction, chronic, continuous.  2. Substance induced mood disorder.  AXIS II: Deferred.  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   AXIS IV: Severe. Long history of heroin addiction and problems related to the same. Occupational leading to financial and being homeless and having to live on the streets or with his siblings.   AXIS V: Global Assessment of Functioning 25.   PLAN: The patient admitted to [facility] for close observation, evaluation and help. He will be started on clonidine detox. He will be given medications for his human immunodeficiency virus positive as recommended by [facility]. During the stay in the hospital he will be given milieu therapy and supportive counseling. As soon as he is ready, he will take part in individual and group therapy where substance abuse focused on heroin addiction will be addressed. At the time of discharge he will be completely detoxed and appropriate followup appointment in a structured program will be made because he wants to do  it.    ____________________________ Jannet Mantis. Guss Bunde, MD skc:ap D: 03/30/2012 19:19:04 ET T: 03/31/2012 08:48:14 ET JOB#: [id number]  cc: Monika Salk K. Guss Bunde, MD, <Dictator> Beau Fanny MD ELECTRONICALLY SIGNED 03/31/2012 19:26

## 2015-03-28 NOTE — Discharge Summary (Signed)
PATIENT NAME:  Zachary CarinaWARREN, Terik MR#:  [id number] DATE OF BIRTH:  1946/05/23  DATE OF ADMISSION:  03/30/2012 DATE OF DISCHARGE:  04/04/2012  HOSPITAL COURSE: See the dictated History and Physical for details of admission. This 69 year old man with a long history of heroin dependence presented himself to the Emergency Room requesting detox. He had tried stopping heroin on his own and had been unable to do it. He has a history of feeling very sick during withdrawal. He has very little social support. No real place to stay. He is deciding that he really wants to get into long-term treatment and change his life around. In the hospital he was treated with standing doses of clonidine and Flexeril. His blood pressure was a little bit low, but he did not have any problem with dizziness or other side effects. He was cooperative with treatment. He attended groups appropriately. His affect was always calm and polite and appropriate. He continued to endorse a desire to stop using heroin. After consultation with social work, he agreed to referral to the alcohol and drug abuse treatment center for inpatient treatment. At this point he is not having active symptoms of opiate withdrawal.  His vital signs appear stable. He has been continued on his medicine for his HIV disease. His mood and affect are calm without any sign of acute depression or dangerousness.   PLAN: The plan is for transfer voluntarily to [facility] tomorrow. Today he went out on a pass to visit social services with a minister, hopefully to further his benefits, which should assist him with placement later.   LABORATORY RESULTS: T helper cells were checked and showed an absolute level of 694, which is well within the normal range. CD4 percentage was 36.5. I have checked a viral load but that has not come back yet. Magnesium was a little low at 1.6. Drug screen interestingly was completely negative on admission. Urinalysis unremarkable. TSH 2.15. Alcohol level  undetectable. AST and total protein a little bit high, not extremely so. Hemoglobin a little low at 11.8, white count 5.2, hematocrit 36, platelets normal.   DISCHARGE MEDICATIONS:  1. Truvada 1 tablet daily.  2. Kaletra 2 tablets twice a day.   DISCHARGE MENTAL STATUS EXAMINATION: Casually dressed, reasonably well-groomed man, looks his stated age. Cooperative, polite, and appropriate. Good eye contact. Normal psychomotor activity. Speech normal rate, tone, and volume. Affect euthymic, reactive, appropriate, looks happy about wanting to go for rehab. Mood stated as being pretty good. Thoughts are lucid and directed with no signs of loosening of associations or delusional thinking. Denies hallucinations. Denies suicidal or homicidal ideation. Shows pretty good judgment and insight.   DISPOSITION: Transfer voluntarily to [facility] for further substance abuse treatment.   DIAGNOSIS PRINCIPLE AND PRIMARY:  AXIS I: Opiate dependence.   SECONDARY DIAGNOSES:  AXIS I: No further.   AXIS II: No diagnosis.   AXIS III: HIV positive. Opiate withdrawal.   AXIS IV: Severe from very limited social resources.   AXIS V: Functioning at time of discharge: 60.     ____________________________ Audery AmelJohn T. Camela Wich, MD jtc:bjt D: 04/03/2012 16:37:00 ET T: 04/03/2012 16:57:34 ET JOB#: [id number]  cc: Audery AmelJohn T. Zariah Jost, MD, <Dictator> Audery AmelJOHN T Bookert Guzzi MD ELECTRONICALLY SIGNED 04/05/2012 8:54

## 2015-04-04 NOTE — H&P (Signed)
PATIENT NAME:  Zachary Hurley, Zachary Hurley MR#:  [id number] DATE OF BIRTH:  1946-02-02  DATE OF ADMISSION:  01/19/2015  INITIAL PSYCHIATRIC ASSESSMENT  IDENTIFYING INFORMATION: The patient is a 69 year old African American male, disabled due to  having a diagnosis of HIV, who has a long history of addiction to heroin.   CHIEF COMPLAINT: "It's not worth living anymore."  HISTORY OF PRESENT ILLNESS: This patient presented to our Emergency Department voluntarily after calling the crisis hotline on February 14. Patient was vague during the interview. He basically stated that he is unhappy with the way that his life has turned in. He has been abusing heroin since the 7360s. He has not had any significant periods of sobriety. He uses as much as he can get to. As a result of his addiction to heroin, he is now positive for hepatitis, unknown if B or C, and also positive for HIV. The patient was diagnosed with HIV about 30 years ago. This patient is malnourished, underweight; his BMI is around 15. He states that he does not have a stable living situation. He has been on and off staying with his sisters. He denies receiving any type of substance abuse or mental health treatment as an outpatient. He states that for the last 2 weeks, his mood has gotten worse. He has not been able to sleep. He has poor appetite, poor energy, poor concentration. He became suicidal and about 2 days ago he took an overdose of 12 pills of ibuprofen. Immediately after the overdose is when he called the hotline. Today the patient denies homicidality or auditory or visual hallucinations. In terms of substance abuse, he reports using heroin heavily since the 60s, mainly IV use. His last use of heroin was on the day of his admission. The longest period of sobriety from heroin has been 5 months. The patient states that he only drinks occasionally, once or twice a month and not much; however, he was unable to really quantify. The patient also smokes marijuana  but states it is not often and states that it is probably just once a week. In terms of treatment for substance abuse, [facility] and has been in detox 1 time in the past, but he denies any other treatment.   PAST PSYCHIATRIC HISTORY: Noncontributory. The patient has never seen a psychiatrist other than [facility]; never been hospitalized before. He denies any prior suicidal attempts and was not taking any antidepressants prior to this admission.   PAST MEDICAL HISTORY: He follows up with [facility] infectious disease for his diagnosis of HIV. He also states he has been told he has hepatitis but does not know what type. The patient said that he has missed a couple [facility] but has been taking his medications, which include Truvada 1 tablet p.o. q. daily, Kaletra 2 tablets q. 12 hours. He denies any history of seizures or head trauma.   FAMILY HISTORY: Noncontributory.   SOCIAL HISTORY: The patient is single, never married. He has 2 grandchildren whom he sees sporadically. He has been staying on and off with his sisters for the last couple of months, does not seem to have much support in the community. He denies any history of legal problems currently or in the past. The patient states he has Medicaid and Medicare and has been receiving disability for about a year due to HIV.   ALLERGIES: No known drug allergies.  REVIEW OF SYSTEMS:  The patient complains of chills, insomnia, diarrhea, and mild abdominal pain and body aches. Denies any other physical symptoms. The rest of the 10 review of systems is negative.   MENTAL STATUS EXAMINATION: The patient is a 69 year old African American male who appears his stated age. He is malnourished and underweight. He is wearing thick reading glasses. His hygiene is limited, as he is wearing hospital scrubs. He has a beard and unkempt. Behavior: He was passive but cooperative with the assessment. Eye contact  was limited, mainly gazing at the floor. Thought process: Bradyphrenic. Psychomotor activity: Retarded. Thought content: Negative for suicidality, homicidality, or auditory or visual hallucinations. No evidence of delusions. Mood: Dysphoric. Affect: Blunted. Insight and judgment: Fair. Cognitive examination: He is alert and oriented in person, place, time, and situation.   PHYSICAL EXAMINATION:  GENERAL: The patient is a 69 year old malnourished African American male in no acute distress.  VITAL SIGNS: Blood pressure 121/86, respirations 18, pulse 105, temperature 98.4.  MUSCULOSKELETAL: Normal gait, normal muscular tone. No evidence of involuntary movements.   LABORATORY RESULTS: BUN 10, creatinine 1.07, sodium 140, potassium 3.5, calcium 8.6. Alcohol was below detection limit. AST 47, ALT 95. Urine toxicology screen is positive for opiates. WBC 7.3, hemoglobin 11.4, hematocrit 36.2, platelet count of 465,000. UA was clear. Acetaminophen level and salicylate level were below detection limit. An EKG shows sinus rhythm with occasional premature ventricular complexes. He also has left ventricular hypertrophy.   DIAGNOSES:  AXIS I: 1.  Major depressive disorder, single episode, severe. 2.  Opiate use disorder, severe. 3.  Opiate use withdrawal.  4.  Cannabis use disorder, mild.  AXIS III: 1.  HIV. 2.  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 3.  Malnutrition, underweight.  AXIS IV: Psychosocial stressors: 1.  Unstable living situation. 2.  Extensive history of substance abuse without significant periods of sobriety.    ASSESSMENT: The patient is a 70 year old African American male with HIV, hepatitis, and heroin addiction who presents to the hospital appearing malnourished and reporting suicidality and symptoms of major depressive disorder. The patient is also reporting complains that are consistent with opiate withdrawal. The patient is interested in long-term treatment for his substance abuse.   PLAN: For  major depressive disorder, the patient will be continued on Effexor XR 75 mg p.o. q.a.m. For insomnia and poor appetite, I will start the patient on mirtazapine 7.5 mg p.o. at bedtime. For underweight and malnourished state, I will consult the dietitian and I will order daily multivitamins with minerals and Ensure t.i.d. For opiate withdrawal, he will be offered supportive treatment with Imodium, Phenergan, and Tylenol as needed. For HIV, he will be continued on his home regimen of Truvada and Kaletra.  DISCHARGE DISPOSITION: I will consult with the social worker and attempt to refer the patient to a long-term residential treatment facility in the area of [place]. Also, he will need to be set up with an outpatient psychiatrist. He has voiced an interest in Suboxone or methadone. Will discuss this with the social worker, as I do not believe there are any methadone clinics that are located in our county.   ____________________________ Jimmy Footman, MD ahg:ST D: 01/20/2015 11:28:56 ET T: 01/20/2015 12:38:16 ET JOB#: [id number]  cc: Jimmy Footman, MD, <Dictator> Horton Chin MD ELECTRONICALLY SIGNED 01/21/2015 15:14

## 2015-04-04 NOTE — Consult Note (Signed)
PATIENT NAME:  Zachary CarinaWARREN, Jorma MR#:  [id number] DATE OF BIRTH:  October 26, 1946  DATE OF CONSULTATION:  01/17/2015  CONSULTING PHYSICIAN:  Yechiel Erny K. Takyia Sindt, MD  SUBJECTIVE: The patient was [facility] in [facility] Room, bed 20B . The patient is a 69 year old, African-American male, not employed and is on Disability, not married and lives anywhere that he can and currently calls himself homeless. The patient started having suicidal thoughts when he called the law for help. Chief complaint: "I am having suicidal thoughts if I don't get help. I need inpatient help for my suicidal thoughts and I'm feeling depressed and this has been going on for some time."   PAST PSYCHIATRIC HISTORY: No previous history of inpatient hospital psychiatry. No history of suicide attempts. Not being followed by any psychiatrist.  SOCIAL HISTORY: Alcohol and drugs: The patient admits that he has been using heroin, IV method, 4 bags per day for the past 4 months and last used it 1 or 2 days ago. In addition, he has been getting pills from the streets, where he gets Percocet. 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    MENTAL STATUS: The patient is seen lying in bed. Alert and oriented. Calm, pleasant and cooperative. No agitation. Affect is flat. Mood restricted. Admits feeling depressed. Admits feeling hopeless or helpless. Admits feeling low and down about this. Does have suicidal wishes and thoughts and he will try to do anything he can, if he can. Does not appear to be responding to internal stimuli. Insight and judgment guarded. Impulse control is poor.   IMPRESSION: Mood depression with suicidal ideas, but contracts for safety here. Opiate dependence, chronic.   RECOMMEND: Inpatient hospital psychiatry for close observation, evaluation and help as needed.    ____________________________ Jannet MantisSurya K. Guss Bundehalla,  MD skc:TT D: 01/17/2015 16:42:02 ET T: 01/17/2015 18:07:34 ET JOB#: [id number]  cc: Monika SalkSurya K. Guss Bundehalla, MD, <Dictator> Beau FannySURYA K Issabela Lesko MD ELECTRONICALLY SIGNED 01/23/2015 17:29

## 2015-04-04 NOTE — Consult Note (Signed)
Psychiatry: Follow-up for this 69 year old man who continues to report severe depression.  Patient continues to complain of severely depressed mood and a feeling of hopelessness.  Continues to endorse suicidal ideation with thoughts about walking out into traffic. review of systems he continues to complain of abdominal pain decreased appetite and nausea.  Depressed mood.  Denies hallucinations.  Endorses suicidal ideation. mental status he remains withdrawn in his room.  Psychomotor activity very limited.  Affect flat.  Speech minimal.  Thoughts lucid.  Endorses suicidal ideation.  Alert and oriented 4. has become available today.  Per the orders administered over the weekend he will be admitted to the psychiatry ward for further treatment of major depression.  Electronic Signatures: Braniyah Besse, Jackquline DenmarkJohn T (MD)  (Signed on 16-Feb-16 18:51)  Authored  Last Updated: 16-Feb-16 18:51 by Audery Amellapacs, Simran Mannis T (MD)

## 2015-04-04 NOTE — Consult Note (Signed)
Psychiatry: Follow-up for this patient expressing suicidality.  He tells me today that he continues to have "bad thoughts".  Mood is bad.  Has hopelessness about himself.  Continues to have some passive suicidality. feels tired.  Poor appetite.  General sense of hopelessness. mental status exam this is a chronically ill-appearing gentleman.  Eye contact intermittent.  Psychomotor activity sluggish.  Speech decreased in rate tone and volume.  Denies active suicidal plan but still has some passive suicidality and hopelessness. is homeless with very minimal supports. [name] admitted the patient over the weekend.  Orders will proceed and the patient can be admitted downstairs.  May need only brief stabilization.  No change to medicine right now.  Electronic Signatures: Gust Eugene, Jackquline DenmarkJohn T (MD)  (Signed on 15-Feb-16 17:38)  Authored  Last Updated: 15-Feb-16 17:38 by Audery Amellapacs, Aubre Quincy T (MD)

## 2015-04-04 NOTE — H&P (Signed)
PATIENT NAME:  Zachary Hurley, Zachary Hurley MR#:  [id number] DATE OF BIRTH:  [birth year]  DATE OF ADMISSION:  01/08/2015  PRIMARY CARE PHYSICIAN: No local.  REFERRING PHYSICIAN: Sheryl L. Mindi Junker, MD   CHIEF COMPLAINT: Cough, shortness of breath with sputum for the past 2 days.   HISTORY OF PRESENT ILLNESS: A 69 year old male with a history of HIV and pneumonia last year, who presented the ED with the above chief complaint. The patient is alert, awake, oriented, in no acute distress. According to the patient, the patient started to have a cough with sputum and shortness of breath 2 days ago. In addition, the patient has a headache, dizziness, and diffuse generalized weakness. The patient also complains of fever, chills. The patient had chest pain while coughing, which is about an 8/10 without radiation. The patient was found hypoxic with oxygen saturation at 70s in the ED, was treated with nebulizer treatment and oxygen. Chest x-ray showed left sided pneumonia. The patient was treated with Levaquin.   PAST MEDICAL HISTORY:  1. HIV and pneumonia with Haemophilus influenzae last November.  2. Chronic bronchiectasis.  3. Depression.  4. Steroid dependence.  5. History of heroine abuse.   SOCIAL HISTORY: Denies any smoking or drinking or illicit drugs. The patient has a history of heroine abuse, but he denied any drug abuse recently.   PAST SURGICAL HISTORY: None.   ALLERGIES: None.   HOME MEDICATIONS:  1. Ventolin HFA CFC-free 90 mcg 2 puffs 4 times a day p.r.n.  2. Truvada 200 mg/300 mg tablets 1 tablet once a day. 3. Kaletra 200 mg/50 mg 2 tablets b.i.d.   REVIEW OF SYSTEMS:  CONSTITUTIONAL: The patient has a fever, chills, headache, dizziness, and generalized weakness.  EYES: No double vision, no blurred vision.  EARS, NOSE, AND THROAT: No postnasal drip, slurred speech, or dysphagia.  CARDIOVASCULAR: Positive for chest pain. No palpitations. No orthopnea, no nocturnal dyspnea, no leg edema.   PULMONARY: Positive for cough, sputum, shortness of breath. No wheezing. No hemoptysis.  GASTROINTESTINAL: No abdominal pain, nausea, vomiting, diarrhea. No melena or bloody stool.  GENITOURINARY: No dysuria, hematuria, or incontinence.  SKIN: No rash or jaundice.  NEUROLOGY: No syncope, loss of consciousness, or seizure.  ENDOCRINOLOGY: No polyuria, polydipsia, heat or cold intolerance.  HEMATOLOGY: No easy bleeding or bleeding.   PHYSICAL EXAMINATION: VITAL SIGNS: Temperature 98, blood pressure 112/79, pulse 95, oxygen saturation 100% on oxygen by nasal cannula.  GENERAL: The patient is alert, awake, oriented, in no acute distress.  HEENT: Pupils round, equal and reactive to light and accommodation. Dry oral mucosa. Clear oropharynx.  NECK: Supple. No JVD or carotid bruit. No lymphadenopathy. No thyromegaly. No thyromegaly.  CARDIOVASCULAR: S1 and S2. Regular rate and rhythm. No murmurs, gallop.  PULMONARY: Bilateral air entry. No wheezing or rales. No use of accessory muscles to breathe.  ABDOMEN: Soft. No distention. No tenderness. No organomegaly. Bowel sounds present.  EXTREMITIES: No edema, clubbing, or cyanosis. No calf tenderness. Bilateral pedal pulses present.  SKIN: No rash or jaundice.  NEUROLOGIC: A and O x 3. No focal deficit. Power 4/5. Sensation intact.   LABORATORY DATA: Glucose 240, sodium 134, chloride 97, bicarbonate 30. WBC 15.1, hemoglobin 11.7, platelets 413. ABG showed pH of 7.48, pCO2 42, pO2 80. BUN 50, creatinine 1.53, sodium 134, potassium 4.3, chloride 97, bicarbonate 30. Albumin 2.5, troponin 0.04. Chest x-ray showed air space consolidation in the left lower lobe, underlying emphysema with areas of pulmonary fibrotic type changes in the scarring. EKG showed  normal sinus rhythm at 92 BPM.   IMPRESSION: 1. Left lower lobe pneumonia.  2. Acute renal failure with dehydration.  3. Hyponatremia.  4. History of human immunodeficiency virus and drug abuse.   5. Anemia of chronic disease.  6.   Malnutrition.  PLAN OF TREATMENT: 1. The patient will be admitted to medical floor. The patient was treated with Levaquin  in the ED. I will start Zithromax Rocephin, and follow up blood culture, sputum culture, and CBC. We will get an ID consult from Dr. Sampson GoonFitzgerald and check the patient's CD4 count.  2. For acute renal failure with dehydration, we will continue IV fluid support with normal saline and follow up BMP.  3. For human immunodeficiency virus, we will continue the patient's HIV medication. The patient said he is taking his HIV medications on and off, sometimes he missed the medication. I will get ID consult from Dr. Sampson GoonFitzgerald.  4. I discussed the patient's condition and plan of treatment with the patient.   CODE STATUS: The patient wants full code.   TIME SPENT: About 56 minutes    ____________________________ Shaune PollackQing Orissa Arreaga, MD qc:mw D: 01/08/2015 16:05:49 ET T: 01/08/2015 16:27:18 ET JOB#: [id number]  cc: Shaune PollackQing Willamae Demby, MD, <Dictator> Shaune PollackQING Veasna Santibanez MD ELECTRONICALLY SIGNED 01/11/2015 12:25

## 2015-04-04 NOTE — Consult Note (Signed)
PATIENT NAME:  Zachary CarinaWARREN, Dayveon MR#:  [id number] DATE OF BIRTH:  Dec 22, 1945  DATE OF CONSULTATION:  01/11/2015  REFERRING PHYSICIAN:   CONSULTING PHYSICIAN:  Stann Mainlandavid P. Sampson GoonFitzgerald, MD  REQUESTING PHYSICIAN:  Dr. Imogene Burnhen.    REASON FOR CONSULTATION:  Pneumonia and HIV patient.   HISTORY OF PRESENT ILLNESS:   A 69 year old gentleman with history of well controlled HIV, last admitted in November when his viral load was undetectable. He was admitted on February 5 with cough and shortness of breath. Since then he had sputum cultures with Strep pneumoniae. We are consulted for further evaluation.   After his last visit the patient has not followed up for HIV care, but he has been taking his antiretrovirals. [facility] He has been in touch with social work including Laurette SchimkeJulie Willis who has been trying to arrange followup in local HIV clinic. Currently the patient reports feeling somewhat better. He does continue to use heroin.   PAST MEDICAL HISTORY:  1.  HIV well controlled.  2.  Chronic bronchiectasis with recurrent pneumonias, most recently Haemophilus influenzae in November of 2015.  3.  Depression.  4.  Steroid dependent.  5.  Heroin abuse.   SOCIAL HISTORY: He was living with his mother. He is currently homeless. He does not smoke anymore, but quit about 2 years ago. Denies drinking. He does use heroin, last use about one week ago.   PAST SURGICAL HISTORY: None.   ALLERGIES: None.   HOME MEDICATIONS:  He is on Truvada and Kaletra for his antiretroviral regimen and Ventolin.   ANTIBIOTICS SINCE ADMISSION: Include azithromycin and ceftriaxone.   REVIEW OF SYSTEMS: 11 systems reviewed and negative except as per HPI.    PHYSICAL EXAMINATION:  VITAL SIGNS: Temperature 98.2, pulse 84, blood pressure 131/71, respirations 18, saturation 96% on 1 liter.  GENERAL: He is thin. He is in no acute distress.  HEENT: Pupils reactive. Oropharynx clear.  NECK: Supple.  HEART:  Regular.  LUNGS: Poor air movement and mild bibasilar crackles.  ABDOMEN: Soft, nontender, nondistended.  EXTREMITIES: No clubbing, cyanosis or edema.  NEUROLOGIC:  He is alert and oriented x 3. Grossly nonfocal neurologic exam.   DIAGNOSTIC DATA: White blood count currently is 17.3. It was 15.1 at admission February 5. Hemoglobin 9.7, platelets 328,000. Blood cultures February 5, 2 of 2 negative. Clostridium difficile testing negative. Sputum culture February 6 with Streptococcus pneumoniae. Renal function shows a creatinine of 0.94. Sensitivities are pending on the Strep pneumoniae.   IMPRESSION: A 69 year old gentleman with well-controlled HIV on Kaletra and Truvada, who has chronic bronchiectasis and ongoing heroin abuse. He was ruled out for tuberculosis in November when he was admitted with hemoptysis. At that time he had Haemophilus influenzae pneumonia, now he has Streptococcus pneumoniae pneumonia.   RECOMMENDATIONS:  1.  Would recommend finishing a total 10 day course of antibiotics for his pneumonia. At discharge, he can be switched to levofloxacin.  2.  Continue Kaletra and Truvada.   3.  I will check an HIV viral load while he is here.  4.  He will need to follow up with me in infectious disease clinic in the next 1-2 months. I will contact social work at the clinic to help arrange.  5.  Thank you for the consult. I will be glad to follow with you.      ____________________________ Stann Mainlandavid P. Sampson GoonFitzgerald, MD dpf:bu D: 01/11/2015 19:51:47 ET T: 01/11/2015 21:06:24 ET JOB#: [id number]  cc: Stann Mainlandavid P.  Sampson Goon, MD, <Dictator> Jacoria Keiffer Sampson Goon MD ELECTRONICALLY SIGNED 01/13/2015 20:53

## 2015-04-04 NOTE — Discharge Summary (Signed)
PATIENT NAME:  Zachary Hurley, RESER MR#:  [id number] DATE OF BIRTH:  13-Nov-1946  DATE OF ADMISSION:  01/08/2015 DATE OF DISCHARGE:  01/12/2015  DISCHARGE DIAGNOSES: 1.  Acute hypoxic respiratory failure due to pneumonia.  2.  Pneumonia. 3.  Hyponatremia.  4.  Leukocytosis.  5.  Acute renal failure.  6.  Severe malnutrition.   SECONDARY DIAGNOSES:   1. HIV and pneumonia with Haemophilus influenzae last November.  2. Chronic bronchiectasis.  3. Depression.  4. Steroid dependence.  5. History of heroine abuse.    CONSULTATIONS: Infectious disease, Stann Mainland. Sampson Goon, MD  PROCEDURES AND RADIOLOGY: Chest x-ray on February 5 showed as per his consultation in the left lower lobe, which is new; underlying emphysema with areas of pulmonary fibrotic type change and scarring. Extensive lower lobe bronchiectatic change bilaterally.   Chest x-ray on February 8 showed stable left basilar consultation. Increased right lung consolidation. Increased cardiomegaly and pleural effusion suggesting an element of volume overload.   MAJOR LABORATORY PANEL: Blood cultures x2 were negative on February 5.   Sputum culture grew moderate growth of Streptococcus pneumoniae.   Stool for Clostridium difficile was negative.   HISTORY AND SHORT HOSPITAL COURSE: The patient is a 69 year old male with above-mentioned medical problems who was admitted for cough shortness of breath and was found to have left lower lobe pneumonia. Please see Dr. Nicky Pugh dictated history and physical for further details. The patient was found to have acute hypoxic respiratory failure secondary to pneumonia. The patient also had some hyponatremia and was improving with IV fluids. His acute renal failure was also improving, was thought to be due to prerenal azotemia. He also had some leukocytosis, which was improving with antibiotics. Infectious Disease consultation was obtained with Dr. Clydie Braun who agreed with antibiotic management,  recommended changing it to oral antibiotic. The patient was feeling much better by February 9 and he was discharged home in stable condition.   PHYSICAL EXAMINATION: VITAL SIGNS: On the date of discharge, his vital signs were as follows: Temperature 98.2, heart rate 62 per minute, respirations 18 per minute, blood pressure 147/85. He is saturating 95% on room air.   PERTINENT PHYSICAL EXAMINATION ON THE DATE OF DISCHARGE:  CARDIOVASCULAR: S1, S2 normal. No murmurs, rubs, or gallop.  LUNGS: Clear to auscultation bilaterally. No wheezing, rales, rhonchi, crepitation.  ABDOMEN: Soft, benign.  NEUROLOGIC: Nonfocal examination. All other physical examination remained at the baseline.   DISCHARGE MEDICATIONS:   Medication Instructions  truvada 200 mg-300 mg oral tablet  1 tab(s) orally once a day   kaletra 200 mg-50 mg oral tablet  2 tab(s) orally 2 times a day   ventolin hfa cfc free 90 mcg/inh inhalation aerosol  2 puff(s) inhaled 4 times a day, As Needed - for Shortness of Breath   levaquin 750 mg oral tablet  1 tab(s) orally every 24 hours x 5 days    DISCHARGE DIET: Regular.   DISCHARGE ACTIVITY: As tolerated.  DISCHARGE INSTRUCTIONS AND FOLLOWUP: The patient was instructed to follow up with his primary care physician at [facility] in 2 to 4 weeks. He will need followup with [facility] for his HIV care in 1 to 2 weeks.   TOTAL TIME DISCHARGING THIS PATIENT: 45 minutes.  Please note, this patient remains at very high risk for readmission. He did not have a safe disposition plan, as he was homeless and was living with his sister or in a shelter and there was no place for him  to go. Finally, locally [facility] [employer] has agreed to accept him where he is being discharged. placed the patient[facility] Dr. Clydie Braunavid Fitzgerald.   ____________________________ Ellamae SiaVipul S. Sherryll BurgerShah, MD vss:sw D: 01/12/2015  17:30:27 ET T: 01/12/2015 22:30:25 ET JOB#: [id number]  cc: Jatziri Goffredo S. Sherryll BurgerShah, MD, <Dictator> Stann Mainlandavid P. Sampson GoonFitzgerald, MD [facility] Ellamae SiaVIPUL S [facility] MD ELECTRONICALLY SIGNED 01/13/2015 13:07

## 2016-03-12 IMAGING — CT CT CHEST W/ CM
2 of 4 series · 15 of 36 positions shown, 18 images · IV contrast (agent unspecified)
Comparison: Chest radiographs 10/11/2014 and 02/17/2014. CT abdomen
pelvis 02/18/2014.

CLINICAL DATA: HIV, cold symptoms with hemoptysis.

EXAM:
CT CHEST WITH CONTRAST
TECHNIQUE: Multidetector CT imaging of the chest was performed during
intravenous contrast administration.
CONTRAST:  70 cc [id number].

[Series 2: routine chest with · axial · 0.58mm/px · z∈[-235,+70]mm · 12 of 73 slices shown, 15 images]
[im 6/73  mediastinal]
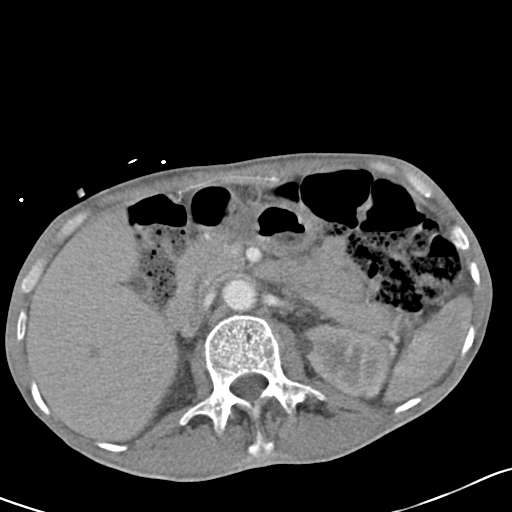
[im 6/73  lung]
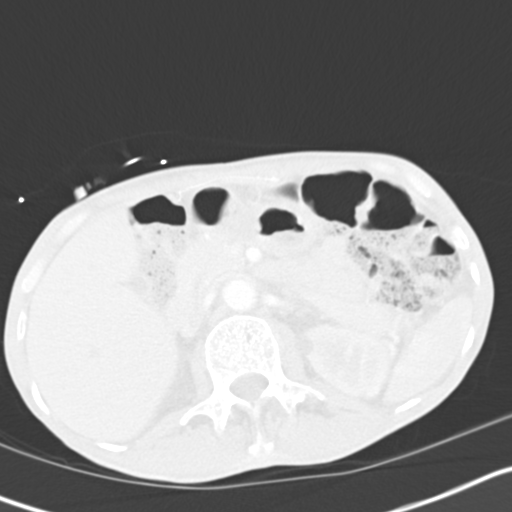
[im 12/73  lung]
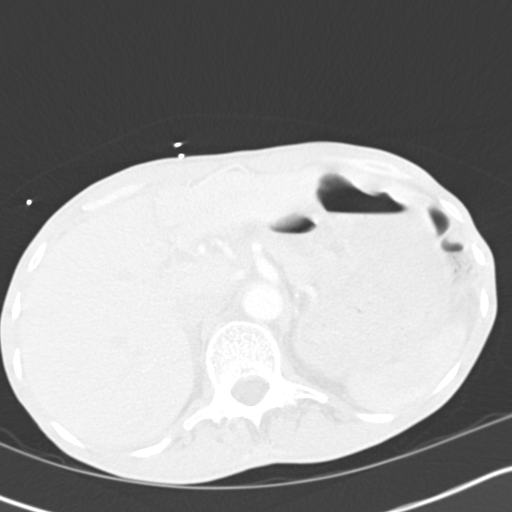
[im 17/73  lung]
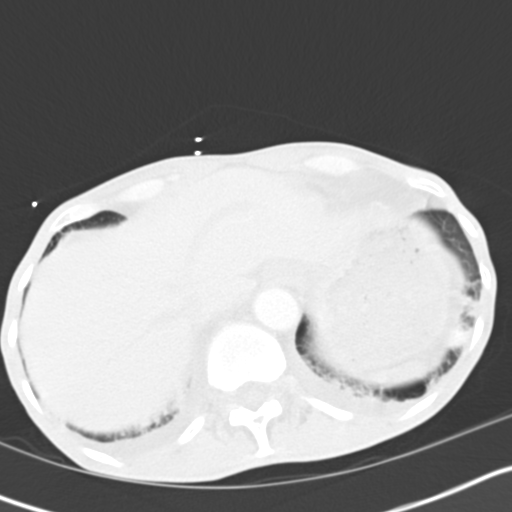
[im 23/73  lung]
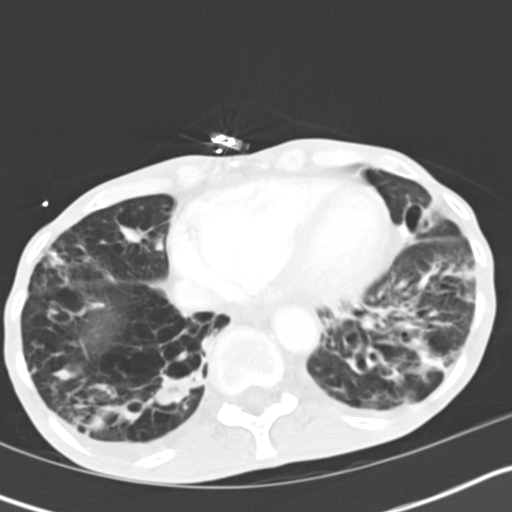
[im 28/73  mediastinal]
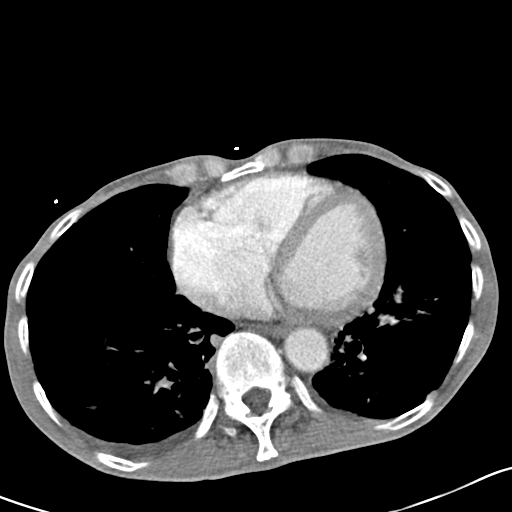
[im 28/73  lung]
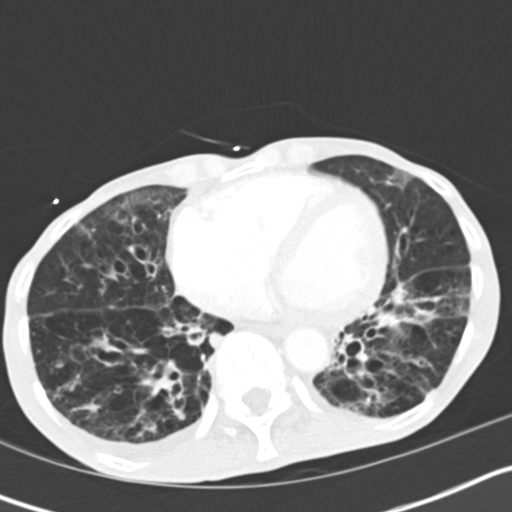
[im 34/73  lung]
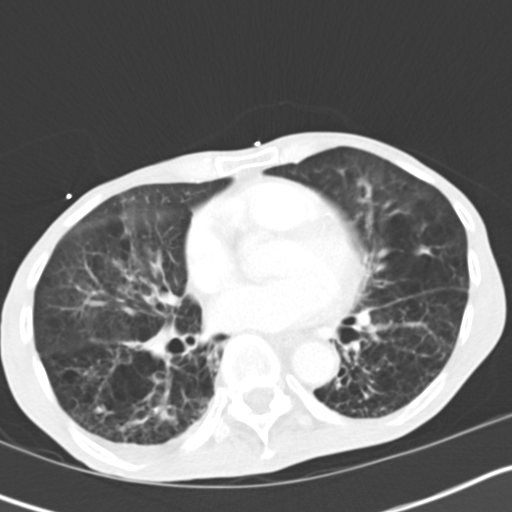
[im 39/73  lung]
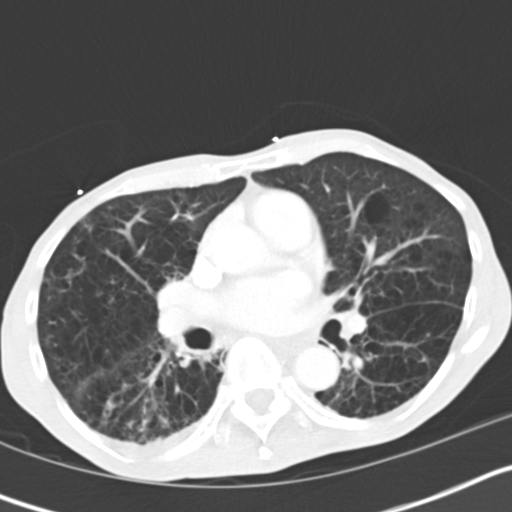
[im 45/73  lung]
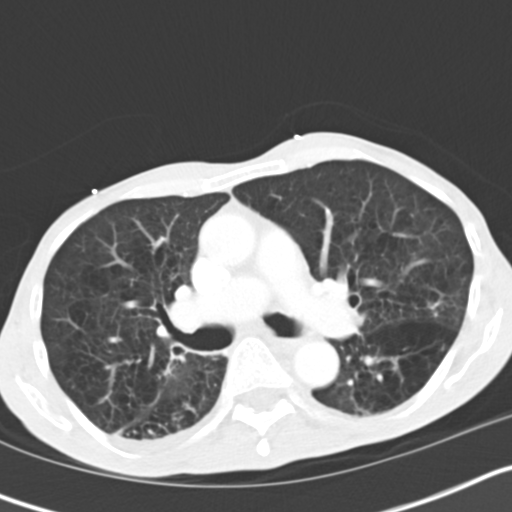
[im 50/73  mediastinal]
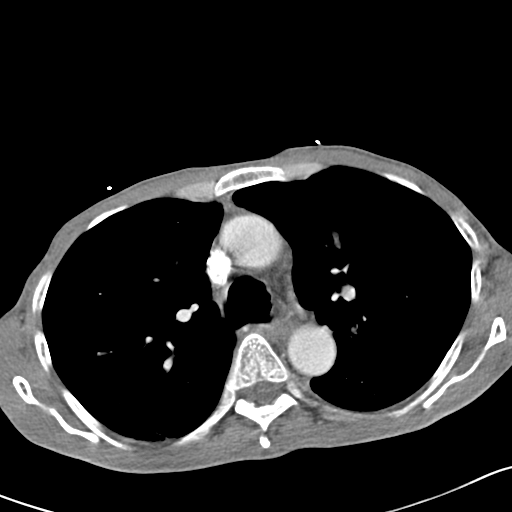
[im 50/73  lung]
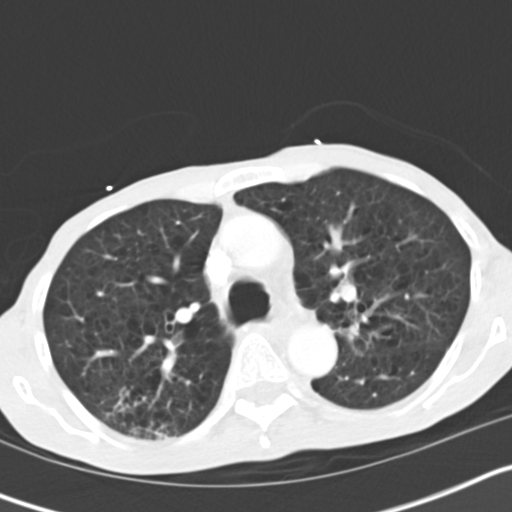
[im 56/73  lung]
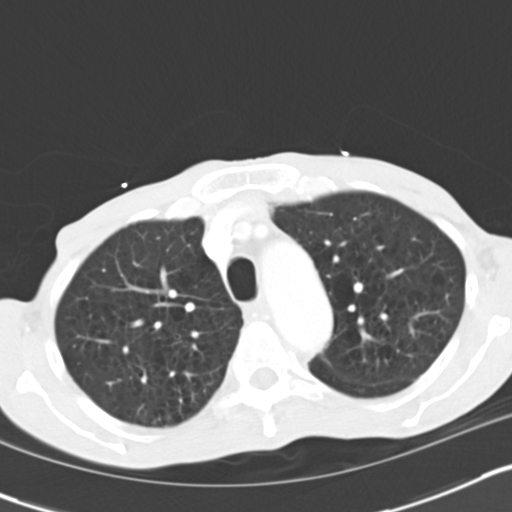
[im 61/73  lung]
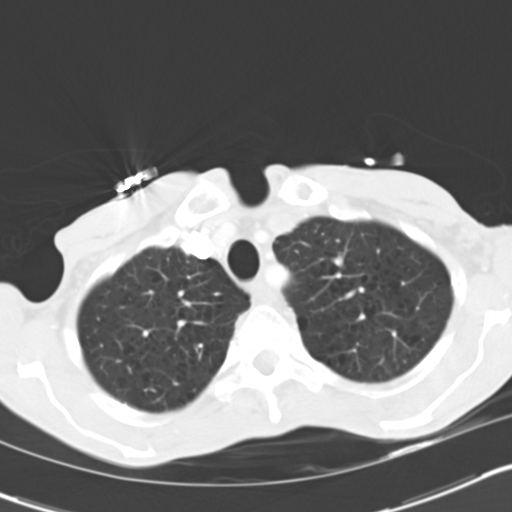
[im 67/73  lung]
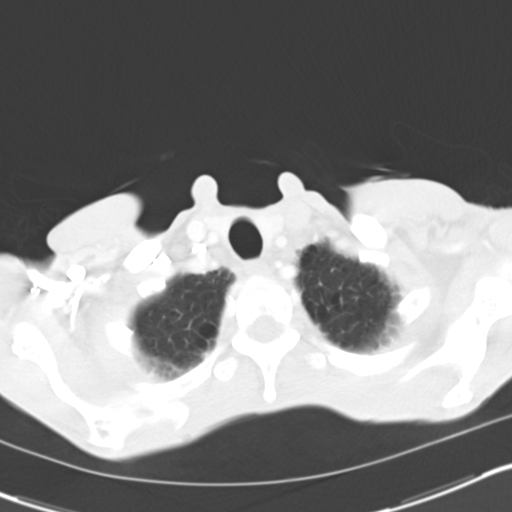

[Series 5: cor routine chest with · coronal · 0.59mm/px · 3 of 101 slices shown]
[im 21/101  lung]
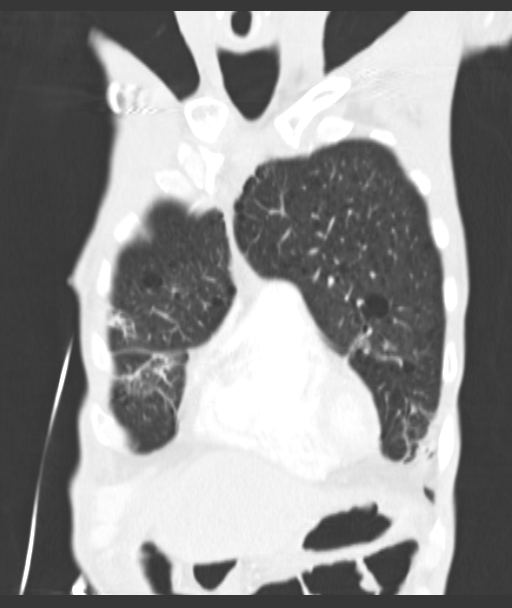
[im 41/101  lung]
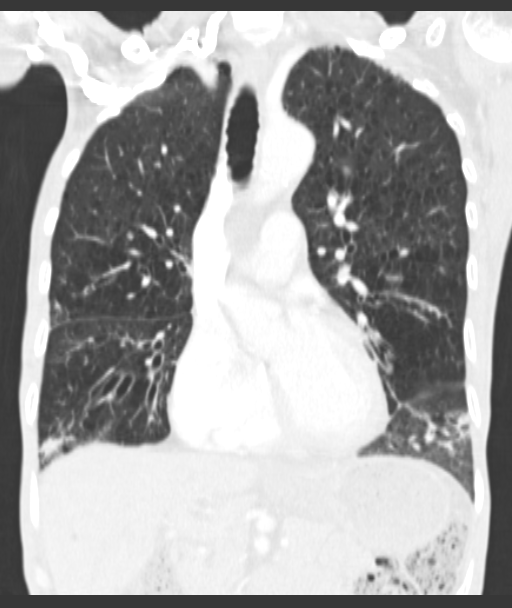
[im 61/101  lung]
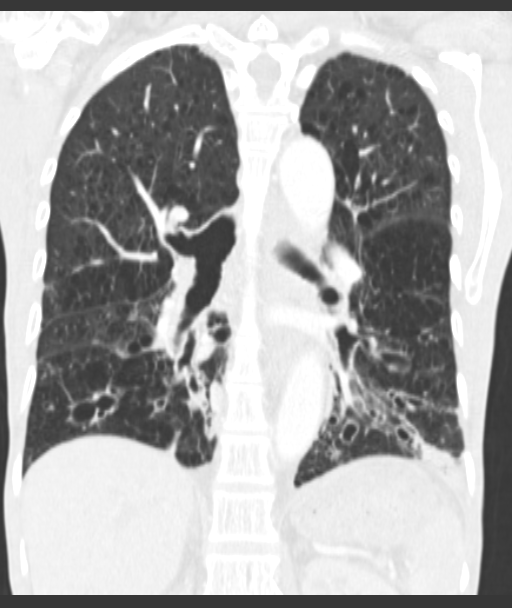

[15 of 36 positions shown; findings below may reference images not displayed]

FINDINGS: No pathologically enlarged mediastinal, hilar or axillary lymph
nodes. Atherosclerotic calcification of the arterial vasculature,
including coronary arteries. Heart is at the upper limits of normal
to mildly enlarged. No pericardial effusion.

Tiny bilateral effusions. Moderate to severe centrilobular emphysema
with minimal bullous change in the apices. Varicose and mild cystic
bronchiectasis in the right middle lobe, lingula and both lower
lobes, with associated mucoid impaction and peribronchovascular
airspace opacification. Debris is seen in the left mainstem
bronchus.

Incidental imaging of the upper abdomen shows a 6 mm low-attenuation
lesion in the right hepatic lobe, too small to characterize.
Visualized portions of the liver, gallbladder and adrenal glands is
otherwise unremarkable. Low-attenuation lesions in the kidneys
measure up to approximately 9 mm on the left, too small to
characterize. 9 mm hyperdense lesion off the upper pole left kidney,
difficult to characterize without precontrast imaging. Visualized
portions of the spleen, pancreas, stomach and bowel are grossly
unremarkable. Ventral hernia repair.

No worrisome lytic or sclerotic lesions.
IMPRESSION: 1. Lower lung zone predominant bronchiectasis with fluid/mucoid
impaction and peribronchovascular airspace disease. Findings are
indicative of an infectious process, possibly atypical or fungal in
etiology.
2. Tiny bilateral effusions.
3. Coronary artery calcification.
4. Bilateral renal lesions, better seen on 02/18/2014. If further
evaluation is desired, MR abdomen without and with contrast is
recommended.

## 2017-06-05 ENCOUNTER — Observation Stay
Admission: EM | Admit: 2017-06-05 | Discharge: 2017-06-06 | Disposition: A | Discharge status: Home / Self Care | Source: Intra-hospital

## 2017-06-05 ENCOUNTER — Observation Stay
Admission: EM | Admit: 2017-06-05 | Discharge: 2017-06-06 | Disposition: A | Discharge status: Home / Self Care | Payer: MEDICARE | Source: Intra-hospital

## 2017-06-05 DIAGNOSIS — J441 Chronic obstructive pulmonary disease with (acute) exacerbation: Principal | ICD-10-CM

## 2017-06-06 MED ORDER — ALBUTEROL SULFATE 2.5 MG/3 ML (0.083 %) SOLUTION FOR NEBULIZATION
Freq: Four times a day (QID) | RESPIRATORY_TRACT | 3 refills | 0 days | Status: CP
Start: 2017-06-06 — End: 2018-08-12

## 2017-06-06 MED ORDER — ALBUTEROL SULFATE HFA 90 MCG/ACTUATION AEROSOL INHALER
RESPIRATORY_TRACT | 11 refills | 0 days | Status: CP | PRN
Start: 2017-06-06 — End: 2019-01-06

## 2017-06-07 MED ORDER — AZITHROMYCIN 250 MG TABLET
ORAL_TABLET | ORAL | 0 refills | 0 days | Status: CP
Start: 2017-06-07 — End: 2017-06-10

## 2017-06-07 MED FILL — AZITHROMYCIN/250MG/TABS: AZITHROMYCIN/250MG/TABS | 3 days supply | Qty: 3 | Fill #0

## 2017-07-02 ENCOUNTER — Ambulatory Visit: Admission: RE | Admit: 2017-07-02 | Discharge: 2017-07-02 | Payer: MEDICARE

## 2017-07-02 DIAGNOSIS — B20 Human immunodeficiency virus [HIV] disease: Secondary | ICD-10-CM

## 2017-07-02 DIAGNOSIS — B182 Chronic viral hepatitis C: Principal | ICD-10-CM

## 2017-07-02 DIAGNOSIS — Z1211 Encounter for screening for malignant neoplasm of colon: Secondary | ICD-10-CM

## 2017-07-02 DIAGNOSIS — Z87891 Personal history of nicotine dependence: Secondary | ICD-10-CM

## 2017-07-02 DIAGNOSIS — E640 Sequelae of protein-calorie malnutrition: Secondary | ICD-10-CM

## 2017-07-02 MED ORDER — EMTRICITABINE 200 MG-TENOFOVIR ALAFENAMIDE FUMARATE 25 MG TABLET
ORAL_TABLET | Freq: Every day | ORAL | 5 refills | 0.00000 days | Status: CP
Start: 2017-07-02 — End: 2017-12-25

## 2017-07-02 MED ORDER — GLECAPREVIR 100 MG-PIBRENTASVIR 40 MG TABLET
Freq: Every day | ORAL | 1 refills | 0.00000 days | Status: CP
Start: 2017-07-02 — End: 2018-04-22

## 2017-07-02 MED ORDER — GLECAPREVIR 100 MG-PIBRENTASVIR 40 MG TABLET: 3 | carton | Freq: Every day | 1 refills | 0 days | Status: AC

## 2017-07-02 MED ORDER — DOLUTEGRAVIR 50 MG TABLET
ORAL_TABLET | Freq: Every day | ORAL | 5 refills | 0.00000 days | Status: CP
Start: 2017-07-02 — End: 2017-12-25

## 2017-07-02 NOTE — Unmapped (Addendum)
Provider today:  Andres Ege, FNP-BC  Clinic nurse:  Marlaine Hind, RN    Contact the clinic as needed for questions or concerns.    ?? The ID clinic phone number is [phone]   ?? The ID clinic fax number is [phone]    For urgent issues on nights and weekends:  Call the ID Physician on-call through the [facility] Operator at [phone].    Please sign up for My [employer] Chart - This is a great way to review your labs and track your appointments    Walk in Clinic:  Please call ahead to speak with the nursing staff.  The walk in hours are Monday, Tuesday, Thursday and Friday 8:30 am ??? 12:00 noon.  Please arrive early.    Medication Refills:  ?? Contact your pharmacy and ask them to electronically send or fax the request to the clinic.  ?? Please bring all medications in original bottles to every appointment.    [facility] Eligibility and HMAP (formerly called ADAP)  ?? Please remember to renew your application twice a year during the periods: January-February and July-August.   ?? Failure to renew your application will result in an ability to receive funded services or medications.  The following items are needed for each renewal period:   ?? [employer] Identification Card or Information systems manager (if you don't have one, then a bill with your name and address in [place])   ?? Proof of income (award letter, W-2, or last two check stubs) -  household income required  ?? Proof of health insurance is required if you have it    If you are unable to come in for renewal, call the ID clinic main number and connect to a financial counselor.  We can mail, fax or e-mail it to you.     HMAP Contact Phone Number: [phone].       Marland Kitchen  Paul Wickersham, FNP  [phone]  CALL ME BEFORE TAKING ANY MEDICATION FOR HEPATITIS C

## 2017-07-02 NOTE — Unmapped (Signed)
Per test claim for Mavyret at the [facility], patient needs Medication Assistance Program for Prior Authorization.

## 2017-07-02 NOTE — Unmapped (Signed)
I reviewed the case with the resident but did not see the patient.  I agree with the assessment and plan as documented in the resident's note. Malena Edman, MD

## 2017-07-02 NOTE — Unmapped (Signed)
Assessment/Plan:     HIV  Lab Results   Component Value Date    ACD4 673 05/08/2016    CD4 35 05/08/2016    HIVCP <40 (H) 11/13/2016    HIVRS Detected (A) 11/13/2016   continues on Descovy and Dolutegravir  Labs today for routine monitoring  Denies known missed medications    Chronic Hepatitis C without hepatic coma  Education and counseling today on treatment plan for Hep C  Last F1 on HCV Liver Fibrosis panel 09/2015  Last HCV RNA positive low level with no NS5a Drug resistance  Prior treatment with interferon while incarcerated but states he did not fail but rather d/c treatment d/t intolerable side effects  Discussed case with Drs Annye Asa and Hurt  Discussed with patient plan to treat with Mavyret x 8 weeks  Check interaction with suboxone minimal effects on treatment  Order placed with [facility] - Medicare and Medicaid insurance  Patient will call this provider prior to starting med if sent by mail  He will take 3 tabs daily with food.  Side effects reviewed  He understands he will need f/u labs in 4 weeks after start    COPD  Recent exacerbation and hospitalization  Doing well today with O2 sat 93% on RA and compliant with nebulizers  Will try to move up f/u appt with Dr. Stann Mainland for mgmt      Weight loss 5# with low appetite  Has CT chest with no lung mass   Repeat low dose chest CT for screen  Discussed freq small meals and adding protein rich foods or shakes supplement    Sexual Health  Reviewed importance of condom use & given free condoms  Stressed importance of adherence and viral suppression to reduce transmission risks  ?? STI screening today: none - declined - no sexual activity since last visit per patient    Mental Health/Substance use  Continues on suboxone  Living in [place]   Has transportation to/from [facility] maintenance    -Colon cancer screening:  Age 57+ done in 2008.  Need to schedule  - Immunizations: Address Shingrix next visit  - Hepatitis A: Hep A total AB positive 04/24/2015  - Hepatitis B: Core AB positive, sAG neg in 2016; repeating Hep B DNA today  - Hepatitis C: Hep C RNA positive with detectable HCV RNA  - TB: Quantiferon TB gold 12/2013 neg; update recommended  - PSA: last done 06/2013 = normal 0.9; update to be discussed with patient next visit  - Anal Pap: not done - sex w/ females no anal   - Lung cancer screen: age 48-80 with >30 pk/yr hx - current smoker or quit<15 y-ordered initially and discussed the order with patient but he does not meet criteria by pack years and see Dr. Sallyanne Kuster note on risk eval - will address with patient at next visit.  #)The 10-year ASCVD risk score Denman George DC Jr., et al., 2013) is: 9.6%  - Lipids: Fasting lipid panel done 11/13/2016 - TC 161 HDL 49 and LDL 93 normal TG  - Exercise: not addressed this visit -   - Ophthalmology: not addressed this visit -  - Dental: upper and lower plates - a bit loose due to weight loss but not impactful on po intake per patient.    Adherence, Prevention, Health Education  Adherence  Adherence discussed   General Education   Hepatitis C and treatment    Disclosure   .  not addressed today       Educational and counseling services took 20 minutes of today's visit in the area of: Hep C and HIV    Follow-up  Return to clinic in 4 weeks after start of HCV medication or sooner if needed.     Referrals   requesting earlier appt with Dr. Stann Mainland    Subjective:    Paul Austin is a 71 y.o. male who presents to the Infectious Disease clinic for return HIV visit.     PCP:  No PCP Per Patient    Chief Complaint: HIV Positive/AIDS and Hepatitis C      HPI: In addition to details in A&P above:    Patient presented to ID clinic for routine HIV care and management.  Doing well with his HIV medications and reports he is not aware of missed dosages of medications.  Fills at [employer] in [place]. Currently staying in [place] by himself.    He is also diagnosed with Hep C with active Hep C viral load and is ready he states to take the treatment for Hep C provided he does not get sick like he did with interferon. No abd pain, jaundice, N/V or other sx. Reported at this time.     MH/SA - on suboxone - denies active use in years of drugs or alcohol.  Denies depression or anxiety.  Spends his time watching TV or going downtown to hang out with my friends    Transportation is not a problem for visits to manage HIV or Hep C treatment plan    Recent hospitalization for COPD - reports under control at this time.  Appetite is low, otherwise breathing is about the same. Gets winded with exertion.  No CP. O2 sat here 93% on RA    Past Medical History:    Past Medical History:   Diagnosis Date   ??? Bronchiectasis (CMS-HCC)    ??? Depression    ??? Drug abuse, IV    ??? Hepatitis C    ??? HIV disease (CMS-HCC)        Past Surgical History:  No past surgical history on file.    Current Medications:    Current Outpatient Prescriptions on File Prior to Visit   Medication Sig Dispense Refill   ??? albuterol (PROAIR HFA) 90 mcg/actuation inhaler Inhale 2 puffs every four (4) hours as needed for wheezing or shortness of breath. 8.5 g 11   ??? albuterol 2.5 mg /3 mL (0.083 %) nebulizer solution Inhale 3 mL (2.5 mg total) by nebulization 4 (four) times a day. 360 mL 3   ??? SUBOXONE 8-2 mg sublingual film PLACE 1 FILM UNDER THE TONGUE THREE TIMES DAILY  0   ??? tiotropium (SPIRIVA WITH HANDIHALER) 18 mcg inhalation capsule Place 1 capsule (18 mcg total) into inhaler and inhale once daily. 30 capsule 5   ??? [DISCONTINUED] dolutegravir (TIVICAY) 50 mg Tab TABLET Take 1 tablet (50 mg total) by mouth daily at 0600. 30 tablet 5   ??? [DISCONTINUED] emtricitabine-tenofovir alafen (DESCOVY) 200-25 mg tablet Take 1 tablet by mouth daily. 30 tablet 5   ??? hydrOXYzine (ATARAX) 25 MG tablet Take 25 mg by mouth nightly as needed.     ??? multivitamin (TAB-A-VITE/THERAGRAN) per tablet Take 1 tablet by mouth daily. (Patient not taking: Reported on 07/02/2017) 30 tablet 11 ??? omega-3 fatty acids-fish oil (FISH OIL) 340-1,000 mg capsule Take 1 capsule by mouth daily. Frequency:BID   Dosage:0.0  Instructions:  Note:Dose: 340-1000MG  (Patient not taking: Reported on 07/02/2017) 30 capsule 6     No current facility-administered medications on file prior to visit.      Allergies: Levofloxacin    Social History:    General     Living situation  Living alone   School       Employment  On disability         Sexual  Number of sex partners since last visit: No sex partners (last sexual activity months)  Has sex with: Sex with women  Condoms for vaginal sex: Always uses condoms for vaginal sex    Relationship       Number partners  No sex partners (last sexual activity months)    Oral sex   .      Anal sex   .      Vaginal sex   . Always uses condoms for vaginal sex         Substance Use     Alcohol  Former alcohol user (years ago)    Tobacco  Former tobacco user (years)    Marijuana       Illicits  Former illicit drug user.      IDU  Previous injection drug user (years ago)   Other    Review of Systems:  See HPI and below for ROS information - remainder of 10 systems negative   Constitutional  No F/C/S- low appetite    Eyes  No blurred vision   ENT  No otalgia, nasal congestion, dysphagia, denture plates up/down   Pulm  No cough  - chronic exercise intolerance d/t SOB from COPD   CV  No chest pain, palpitations   GI  No N/V/D  Does report some intermittent constipation relieved by milk.  No abdominal pain.   GU  No dysuria, hematuria  No rectal symptoms reported   MSK  No myalgias or arthralgias   Psych  No depressed mood or anxiety.  No SI   Neuro  No dizziness or weakness   Skin  No rash       Objective:        BP 122/81  - Pulse 70  - Temp 36.5 ??C (97.7 ??F) (Oral)  - Resp 20  - Ht 180.3 cm (5' 10.98)  - Wt 51.2 kg (112 lb 12.8 oz)  - SpO2 93%  - BMI 15.74 kg/m??     PHYSICAL EXAM:   GEN:   Thin and cachectic appearing male. Alert and appropriate. NAD. Well groomed  EYES:  EOMI, PERRL, non-injected,  anicteric  ENT:   MMM without oral lesions;  upper/lower denture plates; submental lymph node non tender (states present for multiple years unchanged); no thyroid nodules appreciated   LYMPH:  No cervical or supra/infra clavicular adenopathy  RESP:  Normal respiratory effort, distant  breath sounds throughout. No audible wheezing  CV:   RRR, normal S1/S2,   ABD:   BS normal, Soft, NT/ND, no palable organomegaly   GU:   Deferred  RECTAL:  Deferred  SKIN:   Warm and dry, no rash on clothed exam  EXTR:  No edema  MSK:  5/5 Strength  NEURO: A&Ox3, CN grossly intact with no obvious focal deficits.  PSYCH:  Appropriate affect. Good eye contact.  Linear thoughts.  Speech fluent    Recent Labs:    ------------------------------  Absolute CD4 Count   Date Value Status   05/08/2016 673 /uL Final   06/25/2015 701 /  uL Final   10/23/2013 743 /uL Final   06/19/2013 952 /uL Final   04/24/2011 680 CELLS/UL Final     HIV RNA Quant Result (no units)   Date Value Status   11/13/2016 Detected (A) Final   05/08/2016 Not Detected Final   10/01/2015 Not Detected Final   10/23/2013 Not Detected Final   06/19/2013 Not Detected Final   04/24/2011 Not Detected Final     HIV RNA Log(10) (log copies/mL)   Date Value Status   11/13/2016  Final     Comment:     <1.6 log      Lab Results   Component Value Date    WBC 4.7 06/05/2017    WBC 5.1 10/23/2013    HGB 16.4 06/05/2017    HGB 11.7 (L) 10/23/2013    PLT 213 06/05/2017    PLT 321 10/23/2013    CREATININE 0.89 06/06/2017    CREATININE 0.91 10/23/2013    AST  06/05/2017      Comment:      Hemolyzed Specimen      AST 24 10/23/2013    ALT 36 06/05/2017    ALT 20 10/23/2013    TRIG 96 11/13/2016    HDL 49 11/13/2016    LDL 93 11/13/2016     RPR   Date Value Ref Range Status   05/08/2016 Nonreactive Nonreactive Final   06/25/2015 Nonreactive Nonreactive Final     Gonorrhoeae NAA   Date Value Ref Range Status   05/08/2016 Negative Negative Final     Chlamydia trachomatis, NAA   Date Value Ref Range Status   05/08/2016 Negative Negative Final     No results found for: B57  Immunization History   Administered Date(s) Administered   ??? INFLUENZA TIV (TRI) PF (IM) 12/27/2011   ??? Influenza Vaccine Quad (IIV4 PF) 82mo+ injectable 08/28/2013, 08/14/2016   ??? Influenza Virus Vaccine, unspecified formulation 09/01/2015   ??? PPD Test 06/05/2011   ??? Pneumococcal Conjugate 13-Valent 12/31/2015   ??? Pneumococcal Polysaccharide 23 06/05/2011, 11/13/2016   ??? TdaP 06/05/2011

## 2017-07-03 ENCOUNTER — Ambulatory Visit
Admission: RE | Admit: 2017-07-03 | Discharge: 2017-07-03 | Disposition: A | Discharge status: Home / Self Care | Payer: MEDICARE

## 2017-07-03 DIAGNOSIS — B182 Chronic viral hepatitis C: Secondary | ICD-10-CM

## 2017-07-03 DIAGNOSIS — B20 Human immunodeficiency virus [HIV] disease: Principal | ICD-10-CM

## 2017-07-03 DIAGNOSIS — E640 Sequelae of protein-calorie malnutrition: Secondary | ICD-10-CM

## 2017-07-03 LAB — BILIRUBIN TOTAL: Bilirubin:MCnc:Pt:Ser/Plas:Qn:: 0.7

## 2017-07-03 LAB — CBC W/ AUTO DIFF
BASOPHILS ABSOLUTE COUNT: 0 10*9/L (ref 0.0–0.1)
EOSINOPHILS ABSOLUTE COUNT: 0.3 10*9/L (ref 0.0–0.4)
HEMATOCRIT: 37.2 % — ABNORMAL LOW (ref 41.0–53.0)
HEMOGLOBIN: 11.6 g/dL — ABNORMAL LOW (ref 13.5–17.5)
LARGE UNSTAINED CELLS: 3 % (ref 0–4)
LYMPHOCYTES ABSOLUTE COUNT: 2 10*9/L (ref 1.5–5.0)
MEAN CORPUSCULAR HEMOGLOBIN CONC: 31.3 g/dL (ref 31.0–37.0)
MEAN CORPUSCULAR VOLUME: 100.8 fL — ABNORMAL HIGH (ref 80.0–100.0)
MEAN PLATELET VOLUME: 7.9 fL (ref 7.0–10.0)
MONOCYTES ABSOLUTE COUNT: 0.3 10*9/L (ref 0.2–0.8)
NEUTROPHILS ABSOLUTE COUNT: 1.8 10*9/L — ABNORMAL LOW (ref 2.0–7.5)
PLATELET COUNT: 236 10*9/L (ref 150–440)
RED BLOOD CELL COUNT: 3.69 10*12/L — ABNORMAL LOW (ref 4.50–5.90)
RED CELL DISTRIBUTION WIDTH: 14.6 % (ref 12.0–15.0)
WBC ADJUSTED: 4.5 10*9/L (ref 4.5–11.0)

## 2017-07-03 LAB — BASIC METABOLIC PANEL
ANION GAP: 10 mmol/L (ref 9–15)
BLOOD UREA NITROGEN: 13 mg/dL (ref 7–21)
CALCIUM: 9.3 mg/dL (ref 8.5–10.2)
CHLORIDE: 98 mmol/L (ref 98–107)
CO2: 32 mmol/L — ABNORMAL HIGH (ref 22.0–30.0)
CREATININE: 1.04 mg/dL (ref 0.70–1.30)
EGFR MDRD AF AMER: >=60 mL/min/{1.73_m2} (ref >=60)
EGFR MDRD NON AF AMER: >=60 mL/min/{1.73_m2} (ref >=60)
POTASSIUM: 4.3 mmol/L (ref 3.5–5.0)
SODIUM: 140 mmol/L (ref 135–145)

## 2017-07-03 LAB — AST (SGOT): Aspartate aminotransferase:CCnc:Pt:Ser/Plas:Qn:: 26

## 2017-07-03 LAB — LYMPH MARKER LIMITED,FLOW
ABSOLUTE CD3 CNT: 1708 {cells}/uL (ref 915–3400)
ABSOLUTE CD4 CNT: 747 {cells}/uL (ref 510–2320)
ABSOLUTE CD8 CNT: 939 {cells}/uL (ref 180–1520)
CD3% (T CELLS)": 80 % (ref 61–86)
CD4% (T HELPER)": 35 % (ref 34–58)
CD8% T SUPPRESR": 44 % — ABNORMAL HIGH (ref 12–38)

## 2017-07-03 LAB — ABSOLUTE CD3 CNT: Lab: 1708

## 2017-07-03 LAB — CREATININE: Creatinine:MCnc:Pt:Ser/Plas:Qn:: 1.04

## 2017-07-03 LAB — MEAN CORPUSCULAR VOLUME: Lab: 100.8 — ABNORMAL HIGH

## 2017-07-03 LAB — ALT (SGPT): Alanine aminotransferase:CCnc:Pt:Ser/Plas:Qn:: 19

## 2017-07-03 NOTE — Unmapped (Signed)
Phoned patient - he forgot to go to the lab yesterday after the ID clinic appt  He will come in next day or two for lab draw  Andres Ege, FNP  ID

## 2017-07-05 LAB — HEPATITIS C RNA, QUANTITATIVE, PCR: HCV RNA(IU): 38203 [IU]/mL — ABNORMAL HIGH (ref <=0)

## 2017-07-05 LAB — HIV RNA COMMENT: Lab: 0

## 2017-07-05 LAB — HCV RNA COMMENT: Lab: 0

## 2017-07-10 LAB — QUANTIFERON TB GOLD
QUANTIFERON ANTIGEN 1 MINUS NIL: 0.04 [IU]/mL
QUANTIFERON TB GOLD PLUS: NEGATIVE
QUANTIFERON TB NIL VALUE: 0.03 [IU]/mL

## 2017-07-10 LAB — HEPATITIS B DNA, ULTRAQUANTITATIVE, PCR: HBV DNA QUANT: NOT DETECTED

## 2017-07-10 LAB — QUANTIFERON TB NIL VALUE: Lab: 0.03

## 2017-07-10 LAB — HBV DNA QUANT: Hepatitis B virus DNA:PrThr:Pt:Bld:Ord:Probe.amp.tar: NOT DETECTED

## 2017-07-10 NOTE — Unmapped (Unsigned)
MAVYRET APPROVED FOR $0

## 2017-07-11 NOTE — Unmapped (Signed)
[facility]   Patient Onboarding/Medication Counseling    Mr.Paul Austin is a 71 y.o. male with Hepatitis C who I am counseling today on initiation of therapy.    Medication: Mavyret    Verified patient's date of birth / HIPAA.      Education Provided: ??    Dose/Administration discussed: Yes. This medication should be taken  with food.     Storage requirements: this medicine should be stored at room temperature.     Side effects discussed: Discussed common side effects, including headache,feeling tired or weak, upset stomach, loose stools. If patient experiences signs of allergic reactions (see LexiComp), signs of liver dysfunction (see LexiComp), they need to call the doctor.  Patient will receive a Lexi-Comp drug information handout with shipment.    Handling precautions reviewed:  n/a.    Drug Interactions: other medications reviewed and up to date in Epic.  No drug interactions identified.    Comorbidities/Allergies: reviewed and up to date in Epic.    Verified therapy is appropriate and should continue      Delivery Information    Anticipated copay of $0.00 reviewed with patient. Verified delivery address in [facility]SI and reviewed medication storage requirement.    Scheduled delivery date: 07/13/17    Explained that we ship using UPS and this shipment will not require a signature.      Explained the services we provide at [facility] and that each month we would call to set up refills.  Stressed importance of returning phone calls so that we could ensure they receive their medications in time each month.  Informed patient that we should be setting up refills 7-10 days prior to when they will run out of medication.  Informed patient that welcome packet will be sent.      Patient verbalized understanding of the above information as well as how to contact the pharmacy at [phone] option 4 with any questions/concerns.        Patient Specific Needs      ? Patient has no physical or cognitive barriers.    ? Patient prefers to have medications discussed with  Patient     ? Patient is able to read and understand education materials at a high school level or above.        Roderic Palau  [facility] Specialty Pharmacist

## 2017-07-12 MED FILL — MAVYRET/100-40MG/TABS: MAVYRET/100-40MG/TABS | 28 days supply | Qty: 84 | Fill #0

## 2017-07-16 NOTE — Unmapped (Signed)
Received SS 2018 award letter.    RW 0 eligible.    Pt stated needs resource for new eyeglasses. FC advised pt to speak with SWOD.

## 2017-07-17 NOTE — Unmapped (Signed)
Asked case manager to phone back to the clinic (ID) at [facility] and given phone #  Case manager confirmed he received the new medication (assume this is for Hep C) from pharmacy but was not clear he has started.  Advised we need to reinforce adherence, and know the start date.  He will need f/u labs in 4 weeks  Mitchell Iwanicki, FNP  ID

## 2017-07-18 NOTE — Unmapped (Signed)
Patient came to clinic to discuss starting Hep C medication.  Reviewed with patient that he needs to take it everyday, preferably at the same time.  Explained to him that if he missed more that 2 doses, he would need to start cycle again.  Also let him know that it would not interact with his HIV medication.  Patient verbalized understanding.  Carley Hammed, RN

## 2017-07-27 NOTE — Unmapped (Signed)
HCV Follow up    Contacted patient to discuss his hepatitis C treatment/medications. Patient states he started taking Mavyret on 07/21/17. Pt takes his medication at ~ 1 pm every day after his lunch. Denies any missed doses. He endorses new onset mild headaches and mild stomach cramps that go away as the day progresses. Informed patient that these side effects can be expected with this medication, but tend to improve during the tx course. Pt was concerned if Mavyret interacted with his suboxone, but I informed him that there are no expected drug-drug interactions with these medications.     Informed pt that I will follow up next week regarding his symptoms. Patient stated he had the [facility] phone number if any issues arise in the interim.     Maryclare Bean, PharmD, BCPS, CPP   [facility] Pharmacist   PG: [phone]

## 2017-08-03 NOTE — Unmapped (Signed)
HCV Follow up:     Spoke to Small to discuss how his treatment is going and symptoms of stomach cramping. Pt states he has occasional nausea but denies vomiting, diarrhea,  fatigue, weakness. Describes stomach cramping as a knot in his stomach that comes and goes. Pt states his appetite is good (recently ate a baked potato and chili) and that he doesn't think his symptoms are worrisome enough for him to be seen. Pt denies any missed doses and states he plans to stick with it and continue taking his medications.     Plan made to follow up with patient next week by phone.     Maryclare Bean, PharmD, BCPS, CPP   [facility] Pharmacist   PG: [phone]

## 2017-08-10 NOTE — Unmapped (Signed)
Phoned patient - he reported stopping Mavyret 3-5 days ago due to abd cramping.   Start date was 8/18  Can't tolerate med and won't restart  This is preferred agent in [place] [employer]  Discussed with Dr. Servando Snare - no urgency to obtain other HCV med.  Will pursue getting resistance testing and treat with Harvoni with Medicaid override for 12 weeks  Patient informed to hold Mavyret - do not restart; continue all HIV medications  Will touch base next week for f/u plan  Andres Ege, FNP  [facility] clinic

## 2017-09-03 ENCOUNTER — Ambulatory Visit
Admission: RE | Admit: 2017-09-03 | Discharge: 2017-09-03 | Disposition: A | Discharge status: Home / Self Care | Payer: MEDICARE | Attending: Internal Medicine | Admitting: Internal Medicine

## 2017-09-03 ENCOUNTER — Ambulatory Visit
Admission: RE | Admit: 2017-09-03 | Discharge: 2017-09-03 | Disposition: A | Discharge status: Home / Self Care | Payer: MEDICARE

## 2017-09-03 DIAGNOSIS — J479 Bronchiectasis, uncomplicated: Principal | ICD-10-CM

## 2017-09-03 DIAGNOSIS — J449 Chronic obstructive pulmonary disease, unspecified: Secondary | ICD-10-CM

## 2017-12-05 MED ORDER — SPIRIVA WITH HANDIHALER 18 MCG AND INHALATION CAPSULES
ORAL_CAPSULE | Freq: Every day | RESPIRATORY_TRACT | 11 refills | 0.00000 days | Status: CP
Start: 2017-12-05 — End: 2018-07-23

## 2017-12-25 MED ORDER — TIVICAY 50 MG TABLET
ORAL_TABLET | Freq: Every day | ORAL | 0 refills | 0 days | Status: CP
Start: 2017-12-25 — End: 2018-04-22

## 2017-12-25 MED ORDER — DESCOVY 200 MG-25 MG TABLET
ORAL_TABLET | 0 refills | 0 days | Status: CP
Start: 2017-12-25 — End: 2018-04-22

## 2018-04-22 ENCOUNTER — Encounter: Admit: 2018-04-22 | Discharge: 2018-04-23 | Payer: MEDICARE

## 2018-04-22 DIAGNOSIS — R799 Abnormal finding of blood chemistry, unspecified: Secondary | ICD-10-CM

## 2018-04-22 DIAGNOSIS — B182 Chronic viral hepatitis C: Secondary | ICD-10-CM

## 2018-04-22 DIAGNOSIS — B20 Human immunodeficiency virus [HIV] disease: Principal | ICD-10-CM

## 2018-04-22 DIAGNOSIS — R634 Abnormal weight loss: Secondary | ICD-10-CM

## 2018-04-22 MED ORDER — DOLUTEGRAVIR 50 MG TABLET: 50 mg | tablet | Freq: Every day | 0 refills | 0 days | Status: AC

## 2018-04-22 MED ORDER — DOLUTEGRAVIR 50 MG TABLET
ORAL_TABLET | Freq: Every day | ORAL | 0 refills | 0.00000 days | Status: CP
Start: 2018-04-22 — End: 2018-05-20

## 2018-04-22 MED ORDER — EMTRICITABINE 200 MG-TENOFOVIR ALAFENAMIDE FUMARATE 25 MG TABLET: 1 | tablet | Freq: Every day | 0 refills | 0 days | Status: AC

## 2018-04-22 MED ORDER — EMTRICITABINE 200 MG-TENOFOVIR ALAFENAMIDE FUMARATE 25 MG TABLET
ORAL_TABLET | Freq: Every day | ORAL | 0 refills | 0.00000 days | Status: CP
Start: 2018-04-22 — End: 2018-04-22

## 2018-05-07 ENCOUNTER — Encounter: Admit: 2018-05-07 | Discharge: 2018-05-07 | Payer: MEDICARE

## 2018-05-07 DIAGNOSIS — R634 Abnormal weight loss: Principal | ICD-10-CM

## 2018-05-07 DIAGNOSIS — J449 Chronic obstructive pulmonary disease, unspecified: Secondary | ICD-10-CM

## 2018-05-20 MED ORDER — DESCOVY 200 MG-25 MG TABLET
ORAL_TABLET | 0 refills | 0 days | Status: CP
Start: 2018-05-20 — End: 2018-07-22

## 2018-05-20 MED ORDER — TIVICAY 50 MG TABLET
ORAL_TABLET | 0 refills | 0 days | Status: CP
Start: 2018-05-20 — End: 2018-07-22

## 2018-06-01 ENCOUNTER — Encounter: Admit: 2018-06-01 | Discharge: 2018-06-02 | Payer: MEDICARE

## 2018-06-01 DIAGNOSIS — N289 Disorder of kidney and ureter, unspecified: Principal | ICD-10-CM

## 2018-06-07 ENCOUNTER — Encounter: Admit: 2018-06-07 | Discharge: 2018-06-08 | Payer: MEDICARE

## 2018-06-07 DIAGNOSIS — R6881 Early satiety: Secondary | ICD-10-CM

## 2018-06-07 DIAGNOSIS — Z125 Encounter for screening for malignant neoplasm of prostate: Secondary | ICD-10-CM

## 2018-06-07 DIAGNOSIS — R634 Abnormal weight loss: Secondary | ICD-10-CM

## 2018-06-07 DIAGNOSIS — B20 Human immunodeficiency virus [HIV] disease: Principal | ICD-10-CM

## 2018-06-07 MED ORDER — MIRTAZAPINE 15 MG TABLET
ORAL_TABLET | Freq: Every day | ORAL | 0 refills | 0.00000 days | Status: CP
Start: 2018-06-07 — End: 2018-12-30

## 2018-06-10 ENCOUNTER — Encounter: Admit: 2018-06-10 | Discharge: 2018-06-11 | Payer: MEDICARE

## 2018-06-10 DIAGNOSIS — R8281 Pyuria: Principal | ICD-10-CM

## 2018-07-22 ENCOUNTER — Ambulatory Visit: Admit: 2018-07-22 | Discharge: 2018-07-23 | Payer: MEDICARE

## 2018-07-22 DIAGNOSIS — J984 Other disorders of lung: Secondary | ICD-10-CM

## 2018-07-22 DIAGNOSIS — R634 Abnormal weight loss: Principal | ICD-10-CM

## 2018-07-22 DIAGNOSIS — B20 Human immunodeficiency virus [HIV] disease: Secondary | ICD-10-CM

## 2018-07-22 DIAGNOSIS — B182 Chronic viral hepatitis C: Secondary | ICD-10-CM

## 2018-07-22 DIAGNOSIS — Z113 Encounter for screening for infections with a predominantly sexual mode of transmission: Secondary | ICD-10-CM

## 2018-07-22 DIAGNOSIS — J479 Bronchiectasis, uncomplicated: Principal | ICD-10-CM

## 2018-07-22 DIAGNOSIS — R918 Other nonspecific abnormal finding of lung field: Secondary | ICD-10-CM

## 2018-07-22 DIAGNOSIS — J449 Chronic obstructive pulmonary disease, unspecified: Secondary | ICD-10-CM

## 2018-07-22 MED ORDER — EMTRICITABINE 200 MG-TENOFOVIR ALAFENAMIDE FUMARATE 25 MG TABLET
ORAL_TABLET | Freq: Every day | ORAL | 5 refills | 0.00000 days | Status: CP
Start: 2018-07-22 — End: 2018-11-29

## 2018-07-22 MED ORDER — DOLUTEGRAVIR 50 MG TABLET
ORAL_TABLET | Freq: Every day | ORAL | 5 refills | 0 days | Status: CP
Start: 2018-07-22 — End: 2018-11-29

## 2018-07-23 MED ORDER — PARI LC SPRINT NEBULIZER SET
2 refills | 0 days | Status: CP
Start: 2018-07-23 — End: ?

## 2018-07-23 MED ORDER — MUCUS CLEARING DEVICE
0 refills | 0 days | Status: CP
Start: 2018-07-23 — End: ?

## 2018-07-23 MED ORDER — UMECLIDINIUM 62.5 MCG-VILANTEROL 25 MCG/ACTUATION POWDR FOR INHALATION
Freq: Every day | RESPIRATORY_TRACT | 0 refills | 0 days | Status: CP
Start: 2018-07-23 — End: 2019-04-01

## 2018-07-23 MED ORDER — SODIUM CHLORIDE 3 % FOR NEBULIZATION
Freq: Two times a day (BID) | RESPIRATORY_TRACT | 11 refills | 0.00000 days | Status: CP
Start: 2018-07-23 — End: 2019-07-23
  Filled 2018-07-26: qty 240, 30d supply, fill #0

## 2018-07-26 MED FILL — SODIUM CHLORIDE 3 % FOR NEBULIZATION: 30 days supply | Qty: 240 | Fill #0 | Status: AC

## 2018-08-02 ENCOUNTER — Ambulatory Visit: Admit: 2018-08-02 | Discharge: 2018-08-03 | Payer: MEDICARE

## 2018-08-02 DIAGNOSIS — R634 Abnormal weight loss: Principal | ICD-10-CM

## 2018-08-02 DIAGNOSIS — J984 Other disorders of lung: Secondary | ICD-10-CM

## 2018-08-02 DIAGNOSIS — F1111 Opioid abuse, in remission: Secondary | ICD-10-CM

## 2018-08-12 ENCOUNTER — Ambulatory Visit: Admit: 2018-08-12 | Discharge: 2018-08-13 | Payer: MEDICARE | Attending: Medical | Primary: Medical

## 2018-08-12 DIAGNOSIS — B20 Human immunodeficiency virus [HIV] disease: Principal | ICD-10-CM

## 2018-08-12 DIAGNOSIS — R634 Abnormal weight loss: Secondary | ICD-10-CM

## 2018-08-12 DIAGNOSIS — R109 Unspecified abdominal pain: Secondary | ICD-10-CM

## 2018-08-12 DIAGNOSIS — R6881 Early satiety: Secondary | ICD-10-CM

## 2018-08-12 DIAGNOSIS — E440 Moderate protein-calorie malnutrition: Secondary | ICD-10-CM

## 2018-08-12 MED ORDER — PEG-ELECTROLYTE SOLUTION 420 GRAM ORAL SOLUTION
Freq: Once | ORAL | 0 refills | 0.00000 days | Status: CP
Start: 2018-08-12 — End: 2018-08-12

## 2018-08-16 MED ORDER — VENTOLIN HFA 90 MCG/ACTUATION AEROSOL INHALER
11 refills | 0 days | Status: CP
Start: 2018-08-16 — End: 2019-01-06

## 2018-08-23 ENCOUNTER — Encounter: Admit: 2018-08-23 | Discharge: 2018-08-24 | Payer: MEDICARE

## 2018-08-23 DIAGNOSIS — R634 Abnormal weight loss: Secondary | ICD-10-CM

## 2018-08-23 DIAGNOSIS — J479 Bronchiectasis, uncomplicated: Secondary | ICD-10-CM

## 2018-08-23 DIAGNOSIS — R918 Other nonspecific abnormal finding of lung field: Principal | ICD-10-CM

## 2018-08-27 ENCOUNTER — Ambulatory Visit: Admit: 2018-08-27 | Discharge: 2018-08-28 | Payer: MEDICARE

## 2018-08-27 ENCOUNTER — Encounter: Admit: 2018-08-27 | Discharge: 2018-08-28 | Payer: MEDICARE

## 2018-08-27 DIAGNOSIS — R918 Other nonspecific abnormal finding of lung field: Secondary | ICD-10-CM

## 2018-08-27 DIAGNOSIS — J479 Bronchiectasis, uncomplicated: Principal | ICD-10-CM

## 2018-08-28 MED ORDER — AMOXICILLIN 875 MG-POTASSIUM CLAVULANATE 125 MG TABLET
ORAL_TABLET | Freq: Two times a day (BID) | ORAL | 0 refills | 0 days | Status: CP
Start: 2018-08-28 — End: 2018-09-11

## 2018-11-29 MED ORDER — TIVICAY 50 MG TABLET
ORAL_TABLET | 5 refills | 0 days | Status: CP
Start: 2018-11-29 — End: 2018-12-30

## 2018-11-29 MED ORDER — DESCOVY 200 MG-25 MG TABLET
ORAL_TABLET | 5 refills | 0 days | Status: CP
Start: 2018-11-29 — End: 2018-12-30

## 2018-12-10 MED ORDER — PEG-ELECTROLYTE SOLUTION 420 GRAM ORAL SOLUTION
0 refills | 0 days | Status: CP
Start: 2018-12-10 — End: ?

## 2018-12-26 MED ORDER — SPIRIVA WITH HANDIHALER 18 MCG AND INHALATION CAPSULES
ORAL_CAPSULE | Freq: Every day | RESPIRATORY_TRACT | 11 refills | 0 days | Status: CP
Start: 2018-12-26 — End: 2019-04-01

## 2018-12-30 ENCOUNTER — Encounter: Admit: 2018-12-30 | Discharge: 2018-12-31 | Payer: MEDICARE

## 2018-12-30 ENCOUNTER — Ambulatory Visit: Admit: 2018-12-30 | Discharge: 2018-12-31 | Payer: MEDICARE

## 2018-12-30 DIAGNOSIS — J479 Bronchiectasis, uncomplicated: Principal | ICD-10-CM

## 2018-12-30 DIAGNOSIS — R634 Abnormal weight loss: Secondary | ICD-10-CM

## 2018-12-30 DIAGNOSIS — B20 Human immunodeficiency virus [HIV] disease: Principal | ICD-10-CM

## 2018-12-30 DIAGNOSIS — J432 Centrilobular emphysema: Secondary | ICD-10-CM

## 2018-12-30 MED ORDER — TIVICAY 50 MG TABLET
ORAL_TABLET | Freq: Every day | ORAL | 5 refills | 0 days | Status: CP
Start: 2018-12-30 — End: ?

## 2018-12-30 MED ORDER — OMEPRAZOLE 40 MG CAPSULE,DELAYED RELEASE
ORAL_CAPSULE | Freq: Every day | ORAL | 6 refills | 0.00000 days | Status: CP
Start: 2018-12-30 — End: 2019-07-06

## 2018-12-30 MED ORDER — MIRTAZAPINE 15 MG TABLET
ORAL_TABLET | Freq: Every day | ORAL | 1 refills | 0.00000 days | Status: CP
Start: 2018-12-30 — End: 2019-01-24

## 2018-12-30 MED ORDER — DESCOVY 200 MG-25 MG TABLET
ORAL_TABLET | Freq: Every day | ORAL | 5 refills | 0.00000 days | Status: CP
Start: 2018-12-30 — End: ?

## 2019-01-06 MED ORDER — ALBUTEROL SULFATE HFA 90 MCG/ACTUATION AEROSOL INHALER
Freq: Four times a day (QID) | RESPIRATORY_TRACT | 11 refills | 0 days | Status: CP | PRN
Start: 2019-01-06 — End: 2020-01-06

## 2019-01-08 ENCOUNTER — Encounter: Admit: 2019-01-08 | Discharge: 2019-01-08 | Payer: MEDICARE

## 2019-01-08 DIAGNOSIS — R634 Abnormal weight loss: Secondary | ICD-10-CM

## 2019-01-08 DIAGNOSIS — B20 Human immunodeficiency virus [HIV] disease: Principal | ICD-10-CM

## 2019-01-17 ENCOUNTER — Encounter: Admit: 2019-01-17 | Discharge: 2019-01-18 | Payer: MEDICARE

## 2019-01-17 DIAGNOSIS — R634 Abnormal weight loss: Secondary | ICD-10-CM

## 2019-01-17 DIAGNOSIS — J479 Bronchiectasis, uncomplicated: Principal | ICD-10-CM

## 2019-01-24 MED ORDER — MIRTAZAPINE 15 MG TABLET
ORAL_TABLET | Freq: Every day | ORAL | 1 refills | 0 days | Status: CP
Start: 2019-01-24 — End: 2019-03-19

## 2019-03-19 MED ORDER — MIRTAZAPINE 15 MG TABLET
ORAL_TABLET | Freq: Every day | ORAL | 1 refills | 0.00000 days | Status: CP
Start: 2019-03-19 — End: 2019-04-17

## 2019-04-01 ENCOUNTER — Encounter: Admit: 2019-04-01 | Discharge: 2019-04-02 | Payer: MEDICARE

## 2019-04-01 DIAGNOSIS — J432 Centrilobular emphysema: Principal | ICD-10-CM

## 2019-04-01 MED ORDER — ANORO ELLIPTA 62.5 MCG-25 MCG/ACTUATION POWDER FOR INHALATION
Freq: Every day | RESPIRATORY_TRACT | 11 refills | 0 days | Status: CP
Start: 2019-04-01 — End: 2019-05-01

## 2019-04-07 ENCOUNTER — Encounter: Admit: 2019-04-07 | Discharge: 2019-04-08 | Payer: MEDICARE | Attending: Registered" | Primary: Registered"

## 2019-04-17 MED ORDER — MIRTAZAPINE 15 MG TABLET
ORAL_TABLET | 1 refills | 0 days | Status: CP
Start: 2019-04-17 — End: ?

## 2019-05-07 ENCOUNTER — Encounter: Admit: 2019-05-07 | Discharge: 2019-05-08 | Payer: MEDICARE | Attending: Registered" | Primary: Registered"

## 2019-06-10 ENCOUNTER — Encounter: Admit: 2019-06-10 | Discharge: 2019-06-11 | Payer: MEDICARE | Attending: Registered" | Primary: Registered"

## 2019-06-10 DIAGNOSIS — R634 Abnormal weight loss: Principal | ICD-10-CM

## 2019-07-06 MED ORDER — OMEPRAZOLE 40 MG CAPSULE,DELAYED RELEASE
ORAL_CAPSULE | Freq: Every day | ORAL | 6 refills | 30.00000 days | Status: CP
Start: 2019-07-06 — End: ?

## 2019-08-14 ENCOUNTER — Ambulatory Visit: Admit: 2019-08-14 | Discharge: 2019-08-15 | Payer: MEDICARE | Attending: Registered" | Primary: Registered"

## 2019-08-14 DIAGNOSIS — R634 Abnormal weight loss: Secondary | ICD-10-CM

## 2019-08-19 ENCOUNTER — Encounter: Admit: 2019-08-19 | Discharge: 2019-08-20 | Payer: MEDICARE

## 2019-08-19 DIAGNOSIS — J479 Bronchiectasis, uncomplicated: Secondary | ICD-10-CM

## 2019-08-19 DIAGNOSIS — J432 Centrilobular emphysema: Secondary | ICD-10-CM

## 2019-08-25 DIAGNOSIS — Z0000 Encounter for general adult medical examination without abnormal findings: Secondary | ICD-10-CM

## 2019-08-29 ENCOUNTER — Encounter: Admit: 2019-08-29 | Discharge: 2019-08-30 | Payer: MEDICARE | Attending: Registered" | Primary: Registered"

## 2019-09-12 ENCOUNTER — Encounter: Admit: 2019-09-12 | Discharge: 2019-09-13 | Payer: MEDICARE | Attending: Registered" | Primary: Registered"

## 2019-10-14 DIAGNOSIS — B20 Human immunodeficiency virus [HIV] disease: Principal | ICD-10-CM

## 2019-10-14 MED ORDER — DESCOVY 200 MG-25 MG TABLET
ORAL_TABLET | 5 refills | 0 days | Status: CP
Start: 2019-10-14 — End: ?

## 2019-10-14 MED ORDER — TIVICAY 50 MG TABLET
ORAL_TABLET | 5 refills | 0 days | Status: CP
Start: 2019-10-14 — End: ?

## 2019-10-22 ENCOUNTER — Encounter
Admit: 2019-10-22 | Discharge: 2019-10-23 | Payer: MEDICARE | Attending: Student in an Organized Health Care Education/Training Program | Primary: Student in an Organized Health Care Education/Training Program

## 2019-10-22 DIAGNOSIS — E440 Moderate protein-calorie malnutrition: Principal | ICD-10-CM

## 2019-10-22 DIAGNOSIS — B20 Human immunodeficiency virus [HIV] disease: Principal | ICD-10-CM

## 2019-10-22 DIAGNOSIS — D890 Polyclonal hypergammaglobulinemia: Principal | ICD-10-CM

## 2019-10-22 DIAGNOSIS — J432 Centrilobular emphysema: Principal | ICD-10-CM

## 2019-10-22 DIAGNOSIS — J479 Bronchiectasis, uncomplicated: Principal | ICD-10-CM

## 2019-10-22 DIAGNOSIS — Z1211 Encounter for screening for malignant neoplasm of colon: Principal | ICD-10-CM

## 2019-10-22 DIAGNOSIS — Z0000 Encounter for general adult medical examination without abnormal findings: Principal | ICD-10-CM

## 2019-10-22 DIAGNOSIS — R1013 Epigastric pain: Principal | ICD-10-CM

## 2019-10-23 ENCOUNTER — Encounter: Admit: 2019-10-23 | Discharge: 2019-10-24 | Payer: MEDICARE | Attending: Registered" | Primary: Registered"

## 2019-11-18 ENCOUNTER — Encounter: Admit: 2019-11-18 | Discharge: 2019-11-18 | Payer: MEDICARE

## 2019-12-16 MED ORDER — OMEPRAZOLE 40 MG CAPSULE,DELAYED RELEASE
ORAL_CAPSULE | Freq: Every day | ORAL | 6 refills | 30.00000 days | Status: CP
Start: 2019-12-16 — End: ?

## 2020-01-27 MED ORDER — ANORO ELLIPTA 62.5 MCG-25 MCG/ACTUATION POWDER FOR INHALATION
11 refills | 0 days | Status: CP
Start: 2020-01-27 — End: 2021-01-26

## 2020-01-29 ENCOUNTER — Encounter: Admit: 2020-01-29 | Discharge: 2020-01-30 | Payer: MEDICARE | Attending: Registered" | Primary: Registered"

## 2020-02-26 ENCOUNTER — Encounter: Admit: 2020-02-26 | Discharge: 2020-02-27 | Payer: MEDICARE | Attending: Registered" | Primary: Registered"

## 2020-03-18 ENCOUNTER — Encounter: Admit: 2020-03-18 | Discharge: 2020-03-19 | Payer: MEDICARE | Attending: Registered" | Primary: Registered"

## 2020-03-18 DIAGNOSIS — R636 Underweight: Principal | ICD-10-CM

## 2020-03-26 DIAGNOSIS — B20 Human immunodeficiency virus [HIV] disease: Principal | ICD-10-CM

## 2020-03-29 MED ORDER — DESCOVY 200 MG-25 MG TABLET
ORAL_TABLET | 0 refills | 0 days | Status: CP
Start: 2020-03-29 — End: ?

## 2020-03-29 MED ORDER — TIVICAY 50 MG TABLET
ORAL_TABLET | 0 refills | 0 days | Status: CP
Start: 2020-03-29 — End: ?

## 2020-04-15 DIAGNOSIS — Z01812 Encounter for preprocedural laboratory examination: Principal | ICD-10-CM

## 2020-04-15 DIAGNOSIS — Z20822 Encounter for preprocedure screening laboratory testing for COVID-19: Principal | ICD-10-CM

## 2020-05-25 DIAGNOSIS — B20 Human immunodeficiency virus [HIV] disease: Principal | ICD-10-CM

## 2020-05-25 MED ORDER — TIVICAY 50 MG TABLET
ORAL_TABLET | 0 refills | 0 days | Status: CP
Start: 2020-05-25 — End: ?

## 2020-05-25 MED ORDER — DESCOVY 200 MG-25 MG TABLET
ORAL_TABLET | 0 refills | 0 days | Status: CP
Start: 2020-05-25 — End: ?

## 2020-05-31 ENCOUNTER — Encounter: Admit: 2020-05-31 | Discharge: 2020-06-01 | Payer: MEDICARE

## 2020-05-31 DIAGNOSIS — B20 Human immunodeficiency virus [HIV] disease: Principal | ICD-10-CM

## 2020-06-01 DIAGNOSIS — B20 Human immunodeficiency virus [HIV] disease: Principal | ICD-10-CM

## 2020-06-02 ENCOUNTER — Ambulatory Visit
Admit: 2020-06-02 | Discharge: 2020-06-29 | Disposition: A | Discharge status: Home Health | Payer: MEDICARE | Admitting: Internal Medicine

## 2020-06-08 MED ORDER — TOBRAMYCIN 300 MG/5 ML IN 0.225 % SODIUM CHLORIDE FOR NEBULIZATION
Freq: Two times a day (BID) | RESPIRATORY_TRACT | 0 refills | 28.00000 days | Status: CN
Start: 2020-06-08 — End: 2020-07-06

## 2020-06-14 ENCOUNTER — Ambulatory Visit: Admit: 2020-06-14 | Payer: MEDICARE

## 2020-06-18 MED ORDER — TOBRAMYCIN 300 MG/5 ML IN 0.225 % SODIUM CHLORIDE FOR NEBULIZATION
Freq: Two times a day (BID) | RESPIRATORY_TRACT | 0 refills | 28 days | Status: CP
Start: 2020-06-18 — End: 2020-06-18

## 2020-06-18 MED ORDER — WALKER
0 refills | 0 days | Status: CP
Start: 2020-06-18 — End: ?

## 2020-06-24 DIAGNOSIS — B20 Human immunodeficiency virus [HIV] disease: Principal | ICD-10-CM

## 2020-06-24 MED ORDER — TIVICAY 50 MG TABLET
ORAL_TABLET | 0 refills | 0 days
Start: 2020-06-24 — End: ?

## 2020-06-24 MED ORDER — DESCOVY 200 MG-25 MG TABLET
ORAL_TABLET | 0 refills | 0 days
Start: 2020-06-24 — End: ?

## 2020-06-25 MED ORDER — TIVICAY 50 MG TABLET
ORAL_TABLET | 0 refills | 0 days
Start: 2020-06-25 — End: ?

## 2020-06-25 MED ORDER — DESCOVY 200 MG-25 MG TABLET
ORAL_TABLET | 0 refills | 0 days
Start: 2020-06-25 — End: ?

## 2020-06-29 MED ORDER — MELATONIN 3 MG TABLET
ORAL_TABLET | Freq: Every evening | ORAL | 1 refills | 30.00000 days | Status: CP
Start: 2020-06-29 — End: 2020-08-28

## 2020-06-29 MED ORDER — ALBUTEROL SULFATE 2.5 MG/3 ML (0.083 %) SOLUTION FOR NEBULIZATION
RESPIRATORY_TRACT | 1 refills | 30 days | Status: CP | PRN
Start: 2020-06-29 — End: 2020-08-28
  Filled 2020-06-29: qty 540, 30d supply, fill #0

## 2020-06-29 MED ORDER — SODIUM CHLORIDE 7 % FOR NEBULIZATION
Freq: Four times a day (QID) | RESPIRATORY_TRACT | 0 refills | 90.00000 days | Status: CP
Start: 2020-06-29 — End: 2020-09-27

## 2020-06-29 MED ORDER — FAMOTIDINE 20 MG TABLET
ORAL_TABLET | Freq: Two times a day (BID) | ORAL | 0 refills | 30.00000 days | Status: CP
Start: 2020-06-29 — End: 2020-07-29
  Filled 2020-06-29: qty 60, 30d supply, fill #0

## 2020-06-29 MED ORDER — NEBULIZER ACCESSORIES MISC
Freq: Four times a day (QID) | 3 refills | 0.00000 days | Status: CP
Start: 2020-06-29 — End: 2020-07-29

## 2020-06-29 MED ORDER — GUAIFENESIN 100 MG/5 ML ORAL LIQUID
ORAL | 0 refills | 2.00000 days | Status: CP | PRN
Start: 2020-06-29 — End: ?

## 2020-06-29 MED ORDER — THIAMINE HCL (VITAMIN B1) 100 MG TABLET
ORAL_TABLET | Freq: Three times a day (TID) | ORAL | 11 refills | 30.00000 days | Status: CP
Start: 2020-06-29 — End: 2021-06-29

## 2020-06-29 MED ORDER — BUPRENORPHINE 8 MG-NALOXONE 2 MG SUBLINGUAL FILM
ORAL_FILM | Freq: Three times a day (TID) | SUBLINGUAL | 0 refills | 4.00000 days | Status: CP
Start: 2020-06-29 — End: 2020-07-03
  Filled 2020-06-29: qty 11, 4d supply, fill #0

## 2020-06-29 MED FILL — ALBUTEROL SULFATE 2.5 MG/3 ML (0.083 %) SOLUTION FOR NEBULIZATION: 30 days supply | Qty: 540 | Fill #0 | Status: AC

## 2020-06-29 MED FILL — FAMOTIDINE 20 MG TABLET: 30 days supply | Qty: 60 | Fill #0 | Status: AC

## 2020-06-29 MED FILL — BUPRENORPHINE 8 MG-NALOXONE 2 MG SUBLINGUAL FILM: 4 days supply | Qty: 11 | Fill #0 | Status: AC

## 2020-06-30 MED ORDER — TIVICAY 50 MG TABLET
ORAL_TABLET | Freq: Every day | ORAL | 1 refills | 30 days | Status: CP
Start: 2020-06-30 — End: 2020-07-30

## 2020-07-19 ENCOUNTER — Institutional Professional Consult (permissible substitution): Admit: 2020-07-19 | Discharge: 2020-07-19 | Payer: MEDICARE | Attending: Registered" | Primary: Registered"

## 2020-07-19 ENCOUNTER — Ambulatory Visit: Admit: 2020-07-19 | Discharge: 2020-07-20 | Payer: MEDICARE

## 2020-07-19 ENCOUNTER — Ambulatory Visit: Admit: 2020-07-19 | Discharge: 2020-07-19 | Payer: MEDICARE

## 2020-07-19 DIAGNOSIS — B20 Human immunodeficiency virus [HIV] disease: Principal | ICD-10-CM

## 2020-07-19 DIAGNOSIS — Z21 Asymptomatic human immunodeficiency virus [HIV] infection status: Principal | ICD-10-CM

## 2020-07-19 DIAGNOSIS — J329 Chronic sinusitis, unspecified: Principal | ICD-10-CM

## 2020-07-19 DIAGNOSIS — J439 Emphysema, unspecified: Principal | ICD-10-CM

## 2020-07-19 DIAGNOSIS — B182 Chronic viral hepatitis C: Principal | ICD-10-CM

## 2020-07-19 DIAGNOSIS — J479 Bronchiectasis, uncomplicated: Principal | ICD-10-CM

## 2020-07-19 DIAGNOSIS — R9389 Abnormal findings on diagnostic imaging of other specified body structures: Principal | ICD-10-CM

## 2020-07-19 DIAGNOSIS — R634 Abnormal weight loss: Principal | ICD-10-CM

## 2020-07-19 DIAGNOSIS — R918 Other nonspecific abnormal finding of lung field: Principal | ICD-10-CM

## 2020-07-19 MED ORDER — ENSURE ORAL LIQUID
Freq: Two times a day (BID) | ORAL | 3 refills | 0.00000 days
Start: 2020-07-19 — End: ?

## 2020-07-19 MED ORDER — TIVICAY 50 MG TABLET
ORAL_TABLET | Freq: Every day | ORAL | 3 refills | 30 days | Status: CP
Start: 2020-07-19 — End: 2020-08-18

## 2020-07-19 MED ORDER — DESCOVY 200 MG-25 MG TABLET
ORAL_TABLET | Freq: Every day | ORAL | 3 refills | 30.00000 days | Status: CP
Start: 2020-07-19 — End: ?

## 2020-07-19 MED ORDER — ALBUTEROL SULFATE HFA 90 MCG/ACTUATION AEROSOL INHALER
Freq: Four times a day (QID) | RESPIRATORY_TRACT | 11 refills | 0.00000 days | Status: CP | PRN
Start: 2020-07-19 — End: 2021-07-19

## 2020-08-06 ENCOUNTER — Ambulatory Visit: Admit: 2020-08-06 | Discharge: 2020-08-07 | Payer: MEDICARE | Attending: Registered" | Primary: Registered"

## 2020-08-06 DIAGNOSIS — R636 Underweight: Principal | ICD-10-CM

## 2020-08-20 ENCOUNTER — Ambulatory Visit: Admit: 2020-08-20 | Payer: MEDICARE

## 2020-08-20 ENCOUNTER — Encounter: Admit: 2020-08-20 | Payer: MEDICARE

## 2020-08-23 ENCOUNTER — Ambulatory Visit: Admit: 2020-08-23 | Payer: MEDICARE

## 2020-08-30 ENCOUNTER — Encounter: Admit: 2020-08-30 | Discharge: 2020-08-31 | Payer: MEDICARE

## 2020-08-30 ENCOUNTER — Ambulatory Visit: Admit: 2020-08-30 | Discharge: 2020-08-31 | Payer: MEDICARE

## 2020-08-30 DIAGNOSIS — E440 Moderate protein-calorie malnutrition: Principal | ICD-10-CM

## 2020-08-30 DIAGNOSIS — R9389 Abnormal findings on diagnostic imaging of other specified body structures: Principal | ICD-10-CM

## 2020-08-30 DIAGNOSIS — J449 Chronic obstructive pulmonary disease, unspecified: Principal | ICD-10-CM

## 2020-08-30 DIAGNOSIS — J479 Bronchiectasis, uncomplicated: Principal | ICD-10-CM

## 2020-09-03 ENCOUNTER — Ambulatory Visit: Admit: 2020-09-03 | Payer: MEDICARE | Attending: Registered" | Primary: Registered"

## 2020-09-10 ENCOUNTER — Encounter: Admit: 2020-09-10 | Payer: MEDICARE | Attending: Registered" | Primary: Registered"

## 2020-09-23 ENCOUNTER — Ambulatory Visit: Admit: 2020-09-23 | Payer: MEDICARE

## 2020-09-23 ENCOUNTER — Encounter: Admit: 2020-09-23 | Payer: MEDICARE

## 2020-10-14 DIAGNOSIS — Z21 Asymptomatic human immunodeficiency virus [HIV] infection status: Principal | ICD-10-CM

## 2020-10-14 DIAGNOSIS — B20 Human immunodeficiency virus [HIV] disease: Principal | ICD-10-CM

## 2020-10-14 MED ORDER — DESCOVY 200 MG-25 MG TABLET
ORAL_TABLET | Freq: Every day | ORAL | 3 refills | 0.00000 days
Start: 2020-10-14 — End: ?

## 2020-10-14 MED ORDER — TIVICAY 50 MG TABLET
ORAL_TABLET | Freq: Every day | ORAL | 3 refills | 0 days
Start: 2020-10-14 — End: ?

## 2020-10-15 MED ORDER — TIVICAY 50 MG TABLET
ORAL_TABLET | Freq: Every day | ORAL | 3 refills | 30.00000 days | Status: CP
Start: 2020-10-15 — End: 2021-01-21

## 2020-10-15 MED ORDER — DESCOVY 200 MG-25 MG TABLET
ORAL_TABLET | Freq: Every day | ORAL | 3 refills | 30 days | Status: CP
Start: 2020-10-15 — End: 2021-01-21

## 2020-11-04 ENCOUNTER — Institutional Professional Consult (permissible substitution): Admit: 2020-11-04 | Discharge: 2020-11-05 | Payer: MEDICARE

## 2020-12-02 ENCOUNTER — Encounter: Admit: 2020-12-02 | Discharge: 2020-12-02 | Payer: MEDICARE

## 2020-12-02 ENCOUNTER — Ambulatory Visit: Admit: 2020-12-02 | Discharge: 2020-12-02 | Payer: MEDICARE

## 2021-01-19 DIAGNOSIS — Z1211 Encounter for screening for malignant neoplasm of colon: Principal | ICD-10-CM

## 2021-01-21 DIAGNOSIS — Z21 Asymptomatic human immunodeficiency virus [HIV] infection status: Principal | ICD-10-CM

## 2021-01-21 DIAGNOSIS — B20 Human immunodeficiency virus [HIV] disease: Principal | ICD-10-CM

## 2021-01-21 MED ORDER — TIVICAY 50 MG TABLET
ORAL_TABLET | Freq: Every day | ORAL | 3 refills | 30 days | Status: CP
Start: 2021-01-21 — End: 2021-02-20

## 2021-01-21 MED ORDER — DESCOVY 200 MG-25 MG TABLET
ORAL_TABLET | Freq: Every day | ORAL | 3 refills | 30.00000 days | Status: CP
Start: 2021-01-21 — End: ?

## 2021-03-06 MED ORDER — BICTEGRAVIR 50 MG-EMTRICITABINE 200 MG-TENOFOVIR ALAFENAM 25 MG TABLET
ORAL_TABLET | Freq: Every day | ORAL | 11 refills | 30.00000 days
Start: 2021-03-06 — End: ?

## 2021-03-07 ENCOUNTER — Ambulatory Visit: Admit: 2021-03-07 | Payer: MEDICARE

## 2021-03-17 ENCOUNTER — Ambulatory Visit
Admit: 2021-03-17 | Payer: MEDICARE | Attending: Student in an Organized Health Care Education/Training Program | Primary: Student in an Organized Health Care Education/Training Program

## 2021-03-28 ENCOUNTER — Encounter: Admit: 2021-03-28 | Payer: MEDICARE

## 2021-03-28 ENCOUNTER — Ambulatory Visit: Admit: 2021-03-28 | Payer: MEDICARE

## 2021-05-03 ENCOUNTER — Encounter: Admit: 2021-05-03 | Payer: MEDICARE

## 2021-05-04 DIAGNOSIS — B20 Human immunodeficiency virus [HIV] disease: Principal | ICD-10-CM

## 2021-05-04 DIAGNOSIS — Z21 Asymptomatic human immunodeficiency virus [HIV] infection status: Principal | ICD-10-CM

## 2021-05-04 MED ORDER — TIVICAY 50 MG TABLET
ORAL_TABLET | 3 refills | 0 days
Start: 2021-05-04 — End: ?

## 2021-05-04 MED ORDER — DESCOVY 200 MG-25 MG TABLET
ORAL_TABLET | Freq: Every day | ORAL | 3 refills | 0.00000 days
Start: 2021-05-04 — End: ?

## 2021-06-03 MED ORDER — BUPRENORPHINE 8 MG-NALOXONE 2 MG SUBLINGUAL FILM
SUBLINGUAL | 0 days
Start: 2021-06-03 — End: ?

## 2021-06-09 ENCOUNTER — Encounter
Admit: 2021-06-09 | Discharge: 2021-06-10 | Payer: MEDICARE | Attending: Student in an Organized Health Care Education/Training Program | Primary: Student in an Organized Health Care Education/Training Program

## 2021-06-09 DIAGNOSIS — D539 Nutritional anemia, unspecified: Principal | ICD-10-CM

## 2021-06-09 DIAGNOSIS — E441 Mild protein-calorie malnutrition: Principal | ICD-10-CM

## 2021-06-09 DIAGNOSIS — E559 Vitamin D deficiency, unspecified: Principal | ICD-10-CM

## 2021-06-09 DIAGNOSIS — R636 Underweight: Principal | ICD-10-CM

## 2021-06-09 MED ORDER — MIRTAZAPINE 15 MG TABLET
ORAL_TABLET | Freq: Every evening | ORAL | 3 refills | 90.00000 days | Status: CP
Start: 2021-06-09 — End: 2021-07-09

## 2021-06-20 ENCOUNTER — Ambulatory Visit: Admit: 2021-06-20 | Payer: MEDICARE

## 2021-06-29 ENCOUNTER — Encounter: Admit: 2021-06-29 | Discharge: 2021-06-30 | Payer: MEDICARE | Attending: Registered" | Primary: Registered"

## 2021-06-29 DIAGNOSIS — E440 Moderate protein-calorie malnutrition: Principal | ICD-10-CM

## 2021-07-14 ENCOUNTER — Encounter
Admit: 2021-07-14 | Payer: MEDICARE | Attending: Student in an Organized Health Care Education/Training Program | Primary: Student in an Organized Health Care Education/Training Program

## 2021-08-10 DIAGNOSIS — R636 Underweight: Principal | ICD-10-CM

## 2021-08-11 ENCOUNTER — Institutional Professional Consult (permissible substitution): Admit: 2021-08-11 | Discharge: 2021-08-12 | Payer: MEDICARE | Attending: Registered" | Primary: Registered"

## 2021-08-11 DIAGNOSIS — R636 Underweight: Principal | ICD-10-CM

## 2021-08-19 ENCOUNTER — Ambulatory Visit: Admit: 2021-08-19 | Payer: MEDICARE

## 2021-09-08 ENCOUNTER — Institutional Professional Consult (permissible substitution): Admit: 2021-09-08 | Payer: MEDICARE | Attending: Registered" | Primary: Registered"

## 2021-09-19 ENCOUNTER — Ambulatory Visit: Admit: 2021-09-19 | Discharge: 2021-09-20 | Payer: MEDICARE

## 2021-09-19 DIAGNOSIS — B20 Human immunodeficiency virus [HIV] disease: Principal | ICD-10-CM

## 2021-09-19 DIAGNOSIS — B182 Chronic viral hepatitis C: Principal | ICD-10-CM

## 2021-09-19 DIAGNOSIS — R053 Chronic cough: Principal | ICD-10-CM

## 2021-09-19 MED ORDER — BICTEGRAVIR 50 MG-EMTRICITABINE 200 MG-TENOFOVIR ALAFENAM 25 MG TABLET
ORAL_TABLET | Freq: Every day | ORAL | 5 refills | 30 days | Status: CP
Start: 2021-09-19 — End: ?

## 2021-09-21 DIAGNOSIS — B20 Human immunodeficiency virus [HIV] disease: Principal | ICD-10-CM

## 2021-09-21 DIAGNOSIS — R059 Cough, unspecified type: Principal | ICD-10-CM

## 2021-09-21 MED ORDER — ALBUTEROL SULFATE HFA 90 MCG/ACTUATION AEROSOL INHALER
Freq: Four times a day (QID) | RESPIRATORY_TRACT | 3 refills | 0 days | Status: CP | PRN
Start: 2021-09-21 — End: 2022-09-21

## 2021-09-21 MED ORDER — ANORO ELLIPTA 62.5 MCG-25 MCG/ACTUATION POWDER FOR INHALATION
Freq: Every day | RESPIRATORY_TRACT | 3 refills | 30 days | Status: CP
Start: 2021-09-21 — End: 2022-09-21

## 2021-09-21 MED ORDER — CIPROFLOXACIN 500 MG TABLET
ORAL_TABLET | Freq: Two times a day (BID) | ORAL | 0 refills | 14.00000 days | Status: CP
Start: 2021-09-21 — End: ?

## 2021-10-18 ENCOUNTER — Ambulatory Visit: Admit: 2021-10-18 | Discharge: 2021-10-19 | Payer: MEDICARE | Attending: Registered" | Primary: Registered"

## 2021-10-18 DIAGNOSIS — R636 Underweight: Principal | ICD-10-CM

## 2021-11-03 ENCOUNTER — Ambulatory Visit
Admit: 2021-11-03 | Payer: MEDICARE | Attending: Student in an Organized Health Care Education/Training Program | Primary: Student in an Organized Health Care Education/Training Program

## 2021-11-15 ENCOUNTER — Institutional Professional Consult (permissible substitution): Admit: 2021-11-15 | Discharge: 2021-11-16 | Payer: MEDICARE | Attending: Registered" | Primary: Registered"

## 2021-11-15 DIAGNOSIS — R636 Underweight: Principal | ICD-10-CM

## 2021-12-08 DIAGNOSIS — B20 Human immunodeficiency virus [HIV] disease: Principal | ICD-10-CM

## 2021-12-16 ENCOUNTER — Institutional Professional Consult (permissible substitution): Admit: 2021-12-16 | Discharge: 2021-12-17 | Payer: MEDICARE | Attending: Registered" | Primary: Registered"

## 2021-12-16 DIAGNOSIS — E440 Moderate protein-calorie malnutrition: Principal | ICD-10-CM

## 2022-01-02 ENCOUNTER — Ambulatory Visit: Admit: 2022-01-02 | Payer: MEDICARE

## 2022-01-03 MED ORDER — ANORO ELLIPTA 62.5 MCG-25 MCG/ACTUATION POWDER FOR INHALATION
Freq: Every day | RESPIRATORY_TRACT | 3 refills | 30.00000 days
Start: 2022-01-03 — End: 2023-01-03

## 2022-01-04 MED ORDER — ANORO ELLIPTA 62.5 MCG-25 MCG/ACTUATION POWDER FOR INHALATION
Freq: Every day | RESPIRATORY_TRACT | 3 refills | 30.00000 days | Status: CP
Start: 2022-01-04 — End: 2023-01-04

## 2022-01-18 ENCOUNTER — Institutional Professional Consult (permissible substitution): Admit: 2022-01-18 | Discharge: 2022-01-19 | Payer: MEDICARE | Attending: Registered" | Primary: Registered"

## 2022-01-18 DIAGNOSIS — R636 Underweight: Principal | ICD-10-CM

## 2022-01-30 ENCOUNTER — Ambulatory Visit: Admit: 2022-01-30 | Discharge: 2022-01-30 | Payer: MEDICARE

## 2022-01-30 DIAGNOSIS — B182 Chronic viral hepatitis C: Principal | ICD-10-CM

## 2022-01-30 DIAGNOSIS — J479 Bronchiectasis, uncomplicated: Principal | ICD-10-CM

## 2022-01-30 DIAGNOSIS — R918 Other nonspecific abnormal finding of lung field: Principal | ICD-10-CM

## 2022-01-30 DIAGNOSIS — Z113 Encounter for screening for infections with a predominantly sexual mode of transmission: Principal | ICD-10-CM

## 2022-01-30 DIAGNOSIS — J449 Chronic obstructive pulmonary disease, unspecified: Principal | ICD-10-CM

## 2022-01-30 DIAGNOSIS — B20 Human immunodeficiency virus [HIV] disease: Principal | ICD-10-CM

## 2022-01-30 MED ORDER — BICTEGRAVIR 50 MG-EMTRICITABINE 200 MG-TENOFOVIR ALAFENAM 25 MG TABLET
ORAL_TABLET | Freq: Every day | ORAL | 5 refills | 30 days | Status: CP
Start: 2022-01-30 — End: ?

## 2022-01-31 DIAGNOSIS — D649 Anemia, unspecified: Principal | ICD-10-CM

## 2022-02-02 DIAGNOSIS — J449 Chronic obstructive pulmonary disease, unspecified: Principal | ICD-10-CM

## 2022-02-02 DIAGNOSIS — E611 Iron deficiency: Principal | ICD-10-CM

## 2022-02-15 ENCOUNTER — Institutional Professional Consult (permissible substitution): Admit: 2022-02-15 | Discharge: 2022-02-16 | Payer: MEDICARE | Attending: Registered" | Primary: Registered"

## 2022-02-15 DIAGNOSIS — R636 Underweight: Principal | ICD-10-CM

## 2022-02-27 ENCOUNTER — Institutional Professional Consult (permissible substitution): Admit: 2022-02-27 | Discharge: 2022-02-28 | Payer: MEDICARE

## 2022-03-15 DIAGNOSIS — F330 Major depressive disorder, recurrent, mild: Principal | ICD-10-CM

## 2022-03-15 DIAGNOSIS — F1120 Opioid dependence, uncomplicated: Principal | ICD-10-CM

## 2022-03-15 DIAGNOSIS — J449 Chronic obstructive pulmonary disease, unspecified: Principal | ICD-10-CM

## 2022-03-15 DIAGNOSIS — B20 Human immunodeficiency virus [HIV] disease: Principal | ICD-10-CM

## 2022-03-27 DIAGNOSIS — F1120 Opioid dependence, uncomplicated: Principal | ICD-10-CM

## 2022-03-27 DIAGNOSIS — J449 Chronic obstructive pulmonary disease, unspecified: Principal | ICD-10-CM

## 2022-03-27 DIAGNOSIS — F330 Major depressive disorder, recurrent, mild: Principal | ICD-10-CM

## 2022-03-27 DIAGNOSIS — B20 Human immunodeficiency virus [HIV] disease: Principal | ICD-10-CM

## 2022-03-29 ENCOUNTER — Institutional Professional Consult (permissible substitution): Admit: 2022-03-29 | Discharge: 2022-03-30 | Payer: MEDICARE | Attending: Registered" | Primary: Registered"

## 2022-03-29 DIAGNOSIS — R636 Underweight: Principal | ICD-10-CM

## 2022-04-11 DIAGNOSIS — F1120 Opioid dependence, uncomplicated: Principal | ICD-10-CM

## 2022-04-11 DIAGNOSIS — B20 Human immunodeficiency virus [HIV] disease: Principal | ICD-10-CM

## 2022-04-11 DIAGNOSIS — J449 Chronic obstructive pulmonary disease, unspecified: Principal | ICD-10-CM

## 2022-04-11 DIAGNOSIS — F330 Major depressive disorder, recurrent, mild: Principal | ICD-10-CM

## 2022-04-19 DIAGNOSIS — J449 Chronic obstructive pulmonary disease, unspecified: Principal | ICD-10-CM

## 2022-04-19 DIAGNOSIS — F1120 Opioid dependence, uncomplicated: Principal | ICD-10-CM

## 2022-04-19 DIAGNOSIS — B20 Human immunodeficiency virus [HIV] disease: Principal | ICD-10-CM

## 2022-04-19 DIAGNOSIS — F330 Major depressive disorder, recurrent, mild: Principal | ICD-10-CM

## 2022-04-19 DIAGNOSIS — E440 Moderate protein-calorie malnutrition: Principal | ICD-10-CM

## 2022-04-21 DIAGNOSIS — E440 Moderate protein-calorie malnutrition: Principal | ICD-10-CM

## 2022-04-21 DIAGNOSIS — F1120 Opioid dependence, uncomplicated: Principal | ICD-10-CM

## 2022-04-21 DIAGNOSIS — F330 Major depressive disorder, recurrent, mild: Principal | ICD-10-CM

## 2022-04-21 DIAGNOSIS — B20 Human immunodeficiency virus [HIV] disease: Principal | ICD-10-CM

## 2022-05-05 DIAGNOSIS — J449 Chronic obstructive pulmonary disease, unspecified: Principal | ICD-10-CM

## 2022-05-05 DIAGNOSIS — B20 Human immunodeficiency virus [HIV] disease: Principal | ICD-10-CM

## 2022-05-05 DIAGNOSIS — F330 Major depressive disorder, recurrent, mild: Principal | ICD-10-CM

## 2022-05-05 DIAGNOSIS — F1120 Opioid dependence, uncomplicated: Principal | ICD-10-CM

## 2022-05-08 ENCOUNTER — Ambulatory Visit: Admit: 2022-05-08 | Discharge: 2022-05-08 | Payer: MEDICARE

## 2022-05-08 ENCOUNTER — Institutional Professional Consult (permissible substitution): Admit: 2022-05-08 | Discharge: 2022-05-08 | Payer: MEDICARE | Attending: Registered" | Primary: Registered"

## 2022-05-08 DIAGNOSIS — R636 Underweight: Principal | ICD-10-CM

## 2022-05-08 DIAGNOSIS — B20 Human immunodeficiency virus [HIV] disease: Principal | ICD-10-CM

## 2022-05-08 DIAGNOSIS — D649 Anemia, unspecified: Principal | ICD-10-CM

## 2022-05-08 DIAGNOSIS — R918 Other nonspecific abnormal finding of lung field: Principal | ICD-10-CM

## 2022-05-08 MED ORDER — PRENATAL VITAMIN NO.76-IRON,CARBONYL 29 MG IRON-FOLIC ACID 1 MG TABLET
ORAL_TABLET | Freq: Every day | ORAL | 0 refills | 30 days | Status: CP
Start: 2022-05-08 — End: ?

## 2022-05-08 MED ORDER — POLYETHYLENE GLYCOL 3350 17 GRAM ORAL POWDER PACKET
PACK | Freq: Every day | ORAL | 0 refills | 30 days | Status: CP | PRN
Start: 2022-05-08 — End: ?

## 2022-05-08 MED ORDER — BICTEGRAVIR 50 MG-EMTRICITABINE 200 MG-TENOFOVIR ALAFENAM 25 MG TABLET
ORAL_TABLET | Freq: Every day | ORAL | 5 refills | 30 days | Status: CP
Start: 2022-05-08 — End: ?

## 2022-05-15 DIAGNOSIS — Z1211 Encounter for screening for malignant neoplasm of colon: Principal | ICD-10-CM

## 2022-05-16 ENCOUNTER — Institutional Professional Consult (permissible substitution): Admit: 2022-05-16 | Discharge: 2022-05-17 | Payer: MEDICARE | Attending: Registered" | Primary: Registered"

## 2022-05-16 DIAGNOSIS — F330 Major depressive disorder, recurrent, mild: Principal | ICD-10-CM

## 2022-05-16 DIAGNOSIS — F1120 Opioid dependence, uncomplicated: Principal | ICD-10-CM

## 2022-05-16 DIAGNOSIS — B20 Human immunodeficiency virus [HIV] disease: Principal | ICD-10-CM

## 2022-05-16 DIAGNOSIS — R634 Abnormal weight loss: Principal | ICD-10-CM

## 2022-05-16 DIAGNOSIS — J449 Chronic obstructive pulmonary disease, unspecified: Principal | ICD-10-CM

## 2022-05-16 DIAGNOSIS — R636 Underweight: Principal | ICD-10-CM

## 2022-05-28 MED ORDER — MIRTAZAPINE 15 MG TABLET
ORAL_TABLET | Freq: Every evening | ORAL | 3 refills | 0 days
Start: 2022-05-28 — End: ?

## 2022-06-05 DIAGNOSIS — J449 Chronic obstructive pulmonary disease, unspecified: Principal | ICD-10-CM

## 2022-06-05 DIAGNOSIS — R636 Underweight: Principal | ICD-10-CM

## 2022-06-05 DIAGNOSIS — B20 Human immunodeficiency virus [HIV] disease: Principal | ICD-10-CM

## 2022-06-05 DIAGNOSIS — F1120 Opioid dependence, uncomplicated: Principal | ICD-10-CM

## 2022-06-05 DIAGNOSIS — F330 Major depressive disorder, recurrent, mild: Principal | ICD-10-CM

## 2022-06-14 ENCOUNTER — Institutional Professional Consult (permissible substitution): Admit: 2022-06-14 | Discharge: 2022-06-15 | Payer: MEDICARE | Attending: Registered" | Primary: Registered"

## 2022-06-14 DIAGNOSIS — R636 Underweight: Principal | ICD-10-CM

## 2022-07-11 DIAGNOSIS — B20 Human immunodeficiency virus [HIV] disease: Principal | ICD-10-CM

## 2022-07-11 DIAGNOSIS — F1120 Opioid dependence, uncomplicated: Principal | ICD-10-CM

## 2022-07-11 DIAGNOSIS — F330 Major depressive disorder, recurrent, mild: Principal | ICD-10-CM

## 2022-07-11 DIAGNOSIS — J449 Chronic obstructive pulmonary disease, unspecified: Principal | ICD-10-CM

## 2022-08-23 ENCOUNTER — Institutional Professional Consult (permissible substitution): Admit: 2022-08-23 | Discharge: 2022-08-24 | Payer: MEDICARE | Attending: Registered" | Primary: Registered"

## 2022-08-23 DIAGNOSIS — Z713 Dietary counseling and surveillance: Principal | ICD-10-CM

## 2022-09-08 ENCOUNTER — Ambulatory Visit: Admit: 2022-09-08 | Payer: MEDICARE

## 2022-09-13 DIAGNOSIS — F1120 Opioid dependence, uncomplicated: Principal | ICD-10-CM

## 2022-09-13 DIAGNOSIS — J449 Chronic obstructive pulmonary disease, unspecified: Principal | ICD-10-CM

## 2022-09-13 DIAGNOSIS — F330 Major depressive disorder, recurrent, mild: Principal | ICD-10-CM

## 2022-09-13 DIAGNOSIS — B20 Human immunodeficiency virus [HIV] disease: Principal | ICD-10-CM

## 2022-09-15 DIAGNOSIS — J449 Chronic obstructive pulmonary disease, unspecified: Principal | ICD-10-CM

## 2022-09-15 DIAGNOSIS — F1120 Opioid dependence, uncomplicated: Principal | ICD-10-CM

## 2022-09-15 DIAGNOSIS — B20 Human immunodeficiency virus [HIV] disease: Principal | ICD-10-CM

## 2022-09-15 DIAGNOSIS — F330 Major depressive disorder, recurrent, mild: Principal | ICD-10-CM

## 2022-09-22 ENCOUNTER — Ambulatory Visit: Admit: 2022-09-22 | Discharge: 2022-09-23 | Payer: MEDICARE | Attending: Registered" | Primary: Registered"

## 2022-09-22 DIAGNOSIS — R636 Underweight: Principal | ICD-10-CM

## 2022-10-11 MED ORDER — MIRTAZAPINE 15 MG TABLET
ORAL_TABLET | Freq: Every evening | ORAL | 3 refills | 90 days | Status: CP
Start: 2022-10-11 — End: 2022-11-10

## 2022-10-18 DIAGNOSIS — E440 Moderate protein-calorie malnutrition: Principal | ICD-10-CM

## 2022-10-18 DIAGNOSIS — F1120 Opioid dependence, uncomplicated: Principal | ICD-10-CM

## 2022-10-18 DIAGNOSIS — F330 Major depressive disorder, recurrent, mild: Principal | ICD-10-CM

## 2022-10-18 DIAGNOSIS — B20 Human immunodeficiency virus [HIV] disease: Principal | ICD-10-CM

## 2022-10-18 DIAGNOSIS — J449 Chronic obstructive pulmonary disease, unspecified: Principal | ICD-10-CM

## 2022-11-03 ENCOUNTER — Institutional Professional Consult (permissible substitution): Admit: 2022-11-03 | Discharge: 2022-11-04 | Payer: MEDICARE | Attending: Registered" | Primary: Registered"

## 2022-11-03 DIAGNOSIS — R636 Underweight: Principal | ICD-10-CM

## 2022-11-20 DIAGNOSIS — B20 Human immunodeficiency virus [HIV] disease: Principal | ICD-10-CM

## 2022-11-20 DIAGNOSIS — E440 Moderate protein-calorie malnutrition: Principal | ICD-10-CM

## 2022-11-20 DIAGNOSIS — F1120 Opioid dependence, uncomplicated: Principal | ICD-10-CM

## 2022-11-20 DIAGNOSIS — F330 Major depressive disorder, recurrent, mild: Principal | ICD-10-CM

## 2022-11-20 DIAGNOSIS — J449 Chronic obstructive pulmonary disease, unspecified: Principal | ICD-10-CM

## 2022-11-23 ENCOUNTER — Ambulatory Visit
Admit: 2022-11-23 | Payer: MEDICARE | Attending: Student in an Organized Health Care Education/Training Program | Primary: Student in an Organized Health Care Education/Training Program

## 2022-11-24 DIAGNOSIS — B20 Human immunodeficiency virus [HIV] disease: Principal | ICD-10-CM

## 2022-11-24 DIAGNOSIS — E440 Moderate protein-calorie malnutrition: Principal | ICD-10-CM

## 2022-11-24 DIAGNOSIS — F330 Major depressive disorder, recurrent, mild: Principal | ICD-10-CM

## 2022-11-24 DIAGNOSIS — F1120 Opioid dependence, uncomplicated: Principal | ICD-10-CM

## 2022-11-24 DIAGNOSIS — J449 Chronic obstructive pulmonary disease, unspecified: Principal | ICD-10-CM

## 2022-12-06 ENCOUNTER — Ambulatory Visit: Admit: 2022-12-06 | Payer: MEDICARE | Attending: Registered" | Primary: Registered"

## 2022-12-08 ENCOUNTER — Ambulatory Visit: Admit: 2022-12-08 | Payer: MEDICARE

## 2022-12-08 ENCOUNTER — Ambulatory Visit
Admit: 2022-12-08 | Discharge: 2022-12-27 | Disposition: A | Discharge status: Skilled Nursing Facility | Payer: MEDICARE | Admitting: Internal Medicine

## 2022-12-13 MED ORDER — TRELEGY ELLIPTA 100 MCG-62.5 MCG-25 MCG POWDER FOR INHALATION
Freq: Every day | RESPIRATORY_TRACT | 0 refills | 60 days
Start: 2022-12-13 — End: ?

## 2022-12-13 MED ORDER — MUCUS CLEARING DEVICE
Freq: Two times a day (BID) | 0 refills | 0 days
Start: 2022-12-13 — End: ?

## 2022-12-14 MED ORDER — ATORVASTATIN 20 MG TABLET
ORAL_TABLET | Freq: Every day | ORAL | 0 refills | 30 days
Start: 2022-12-14 — End: 2023-01-13

## 2022-12-14 MED ORDER — ASPIRIN 81 MG CHEWABLE TABLET
ORAL_TABLET | Freq: Every day | ORAL | 0 refills | 30 days
Start: 2022-12-14 — End: 2023-01-13

## 2022-12-18 MED ORDER — TRELEGY ELLIPTA 100 MCG-62.5 MCG-25 MCG POWDER FOR INHALATION
Freq: Every day | RESPIRATORY_TRACT | 0 refills | 60 days
Start: 2022-12-18 — End: ?

## 2022-12-19 MED ORDER — ATORVASTATIN 20 MG TABLET
ORAL_TABLET | Freq: Every day | ORAL | 0 refills | 30 days
Start: 2022-12-19 — End: 2023-01-18

## 2022-12-19 MED ORDER — ASPIRIN 81 MG CHEWABLE TABLET
ORAL_TABLET | Freq: Every day | ORAL | 0 refills | 30 days
Start: 2022-12-19 — End: 2023-01-18

## 2022-12-21 MED ORDER — BUPRENORPHINE 8 MG-NALOXONE 2 MG SUBLINGUAL FILM
ORAL_FILM | Freq: Three times a day (TID) | SUBLINGUAL | 0 refills | 5 days
Start: 2022-12-21 — End: 2022-12-26

## 2022-12-21 MED ORDER — TRELEGY ELLIPTA 100 MCG-62.5 MCG-25 MCG POWDER FOR INHALATION
Freq: Every day | RESPIRATORY_TRACT | 0 refills | 60 days
Start: 2022-12-21 — End: ?

## 2022-12-22 MED ORDER — ATORVASTATIN 20 MG TABLET
ORAL_TABLET | Freq: Every day | ORAL | 0 refills | 30 days
Start: 2022-12-22 — End: 2023-01-21

## 2022-12-22 MED ORDER — ASPIRIN 81 MG CHEWABLE TABLET
ORAL_TABLET | Freq: Every day | ORAL | 0 refills | 30 days
Start: 2022-12-22 — End: 2023-01-21

## 2022-12-27 MED ORDER — BUPRENORPHINE 8 MG-NALOXONE 2 MG SUBLINGUAL FILM
ORAL_FILM | Freq: Three times a day (TID) | SUBLINGUAL | 0 refills | 7 days | Status: CP
Start: 2022-12-27 — End: 2023-01-03
  Filled 2022-12-27: qty 21, 7d supply, fill #0

## 2022-12-27 MED ORDER — BICTEGRAVIR 50 MG-EMTRICITABINE 200 MG-TENOFOVIR ALAFENAM 25 MG TABLET
ORAL_TABLET | Freq: Every day | ORAL | 5 refills | 30 days | Status: CP
Start: 2022-12-27 — End: ?
  Filled 2022-12-27: qty 30, 30d supply, fill #0

## 2023-01-02 DIAGNOSIS — B20 Human immunodeficiency virus [HIV] disease: Principal | ICD-10-CM

## 2023-01-02 DIAGNOSIS — F1120 Opioid dependence, uncomplicated: Principal | ICD-10-CM

## 2023-01-02 DIAGNOSIS — F330 Major depressive disorder, recurrent, mild: Principal | ICD-10-CM

## 2023-01-02 DIAGNOSIS — J449 Chronic obstructive pulmonary disease, unspecified: Principal | ICD-10-CM

## 2023-01-02 DIAGNOSIS — E440 Moderate protein-calorie malnutrition: Principal | ICD-10-CM

## 2023-01-07 DIAGNOSIS — B20 Human immunodeficiency virus [HIV] disease: Principal | ICD-10-CM

## 2023-01-07 MED ORDER — BIKTARVY 50 MG-200 MG-25 MG TABLET
ORAL_TABLET | Freq: Every day | ORAL | 5 refills | 0 days
Start: 2023-01-07 — End: ?

## 2023-01-08 ENCOUNTER — Ambulatory Visit: Admit: 2023-01-08 | Payer: MEDICARE

## 2023-01-08 MED ORDER — BIKTARVY 50 MG-200 MG-25 MG TABLET
ORAL_TABLET | Freq: Every day | ORAL | 5 refills | 30 days
Start: 2023-01-08 — End: ?

## 2023-01-10 DIAGNOSIS — F330 Major depressive disorder, recurrent, mild: Principal | ICD-10-CM

## 2023-01-10 DIAGNOSIS — B20 Human immunodeficiency virus [HIV] disease: Principal | ICD-10-CM

## 2023-01-10 DIAGNOSIS — F1120 Opioid dependence, uncomplicated: Principal | ICD-10-CM

## 2023-01-10 DIAGNOSIS — E440 Moderate protein-calorie malnutrition: Principal | ICD-10-CM

## 2023-01-10 DIAGNOSIS — J449 Chronic obstructive pulmonary disease, unspecified: Principal | ICD-10-CM

## 2023-01-16 DIAGNOSIS — J449 Chronic obstructive pulmonary disease, unspecified: Principal | ICD-10-CM

## 2023-01-16 DIAGNOSIS — B20 Human immunodeficiency virus [HIV] disease: Principal | ICD-10-CM

## 2023-01-16 DIAGNOSIS — F330 Major depressive disorder, recurrent, mild: Principal | ICD-10-CM

## 2023-01-16 DIAGNOSIS — E440 Moderate protein-calorie malnutrition: Principal | ICD-10-CM

## 2023-01-16 DIAGNOSIS — F1120 Opioid dependence, uncomplicated: Principal | ICD-10-CM

## 2023-01-18 ENCOUNTER — Ambulatory Visit
Admit: 2023-01-18 | Discharge: 2023-01-19 | Payer: MEDICARE | Attending: Student in an Organized Health Care Education/Training Program | Primary: Student in an Organized Health Care Education/Training Program

## 2023-01-18 DIAGNOSIS — R636 Underweight: Principal | ICD-10-CM

## 2023-01-18 DIAGNOSIS — H547 Unspecified visual loss: Principal | ICD-10-CM

## 2023-01-18 DIAGNOSIS — K5909 Other constipation: Principal | ICD-10-CM

## 2023-01-18 DIAGNOSIS — G4700 Insomnia, unspecified: Principal | ICD-10-CM

## 2023-01-18 DIAGNOSIS — B20 Human immunodeficiency virus [HIV] disease: Principal | ICD-10-CM

## 2023-01-18 DIAGNOSIS — K08109 Complete loss of teeth, unspecified cause, unspecified class: Principal | ICD-10-CM

## 2023-01-30 MED FILL — BIKTARVY 50 MG-200 MG-25 MG TABLET: ORAL | 30 days supply | Qty: 30 | Fill #1

## 2023-02-19 ENCOUNTER — Ambulatory Visit: Admit: 2023-02-19 | Payer: MEDICARE

## 2023-02-19 ENCOUNTER — Ambulatory Visit
Admit: 2023-02-19 | Discharge: 2023-03-15 | Disposition: A | Discharge status: Skilled Nursing Facility | Payer: MEDICARE | Source: Intra-hospital | Admitting: Internal Medicine

## 2023-02-19 ENCOUNTER — Encounter: Admit: 2023-02-19 | Payer: MEDICARE

## 2023-02-19 ENCOUNTER — Encounter: Admit: 2023-02-19 | Payer: MEDICARE | Attending: Critical Care Medicine

## 2023-02-19 ENCOUNTER — Encounter
Admit: 2023-02-19 | Discharge: 2023-03-15 | Disposition: A | Discharge status: Skilled Nursing Facility | Payer: MEDICARE | Source: Intra-hospital | Attending: Internal Medicine | Admitting: Internal Medicine

## 2023-02-19 ENCOUNTER — Encounter
Admit: 2023-02-19 | Discharge: 2023-03-15 | Disposition: A | Discharge status: Skilled Nursing Facility | Payer: MEDICARE | Source: Intra-hospital | Attending: Infectious Disease | Admitting: Internal Medicine

## 2023-02-19 ENCOUNTER — Encounter: Admit: 2023-02-19 | Discharge: 2023-03-15 | Payer: MEDICARE | Attending: Infectious Disease

## 2023-03-01 MED ORDER — OXYCODONE 5 MG TABLET
ORAL_TABLET | ORAL | 0 refills | 2 days | PRN
Start: 2023-03-01 — End: 2023-03-06

## 2023-03-01 MED ORDER — BICTEGRAVIR 50 MG-EMTRICITABINE 200 MG-TENOFOVIR ALAFENAM 25 MG TABLET
ORAL_TABLET | Freq: Every day | ORAL | 5 refills | 30 days
Start: 2023-03-01 — End: ?

## 2023-03-02 MED ORDER — ENOXAPARIN 30 MG/0.3 ML SUBCUTANEOUS SYRINGE
SUBCUTANEOUS | 0 refills | 21 days
Start: 2023-03-02 — End: 2023-03-23

## 2023-03-14 MED ORDER — BICTEGRAVIR 50 MG-EMTRICITABINE 200 MG-TENOFOVIR ALAFENAM 25 MG TABLET
ORAL_TABLET | Freq: Every day | ORAL | 5 refills | 30 days
Start: 2023-03-14 — End: ?

## 2023-03-15 MED ORDER — BIKTARVY 50 MG-200 MG-25 MG TABLET
ORAL_TABLET | Freq: Every day | ORAL | 0 refills | 3 days | Status: CP
Start: 2023-03-15 — End: 2023-03-18
  Filled 2023-03-15: qty 3, 3d supply, fill #0

## 2023-03-15 MED ORDER — BUPRENORPHINE 8 MG-NALOXONE 2 MG SUBLINGUAL FILM
ORAL_FILM | Freq: Three times a day (TID) | SUBLINGUAL | 0 refills | 3 days | Status: CP
Start: 2023-03-15 — End: 2023-03-18
  Filled 2023-03-15: qty 9, 3d supply, fill #0

## 2023-03-25 ENCOUNTER — Ambulatory Visit: Admit: 2023-03-25 | Payer: MEDICARE

## 2023-03-25 ENCOUNTER — Ambulatory Visit
Admit: 2023-03-25 | Discharge: 2023-04-02 | Disposition: A | Discharge status: Skilled Nursing Facility | Payer: MEDICARE | Source: Intra-hospital

## 2023-04-02 ENCOUNTER — Ambulatory Visit: Admit: 2023-04-02 | Payer: MEDICARE

## 2023-04-02 MED ORDER — IPRATROPIUM 0.5 MG-ALBUTEROL 3 MG (2.5 MG BASE)/3 ML NEBULIZATION SOLN
Freq: Four times a day (QID) | RESPIRATORY_TRACT | 0 refills | 0.00000 days | Status: CN
Start: 2023-04-02 — End: ?

## 2023-04-12 DIAGNOSIS — B20 Human immunodeficiency virus [HIV] disease: Principal | ICD-10-CM

## 2023-04-12 DIAGNOSIS — J449 Chronic obstructive pulmonary disease, unspecified: Principal | ICD-10-CM

## 2023-04-12 DIAGNOSIS — E440 Moderate protein-calorie malnutrition: Principal | ICD-10-CM

## 2023-04-12 DIAGNOSIS — F330 Major depressive disorder, recurrent, mild: Principal | ICD-10-CM

## 2023-04-12 DIAGNOSIS — F1120 Opioid dependence, uncomplicated: Principal | ICD-10-CM

## 2023-04-13 ENCOUNTER — Ambulatory Visit
Admit: 2023-04-13 | Discharge: 2023-04-18 | Disposition: A | Discharge status: Skilled Nursing Facility | Payer: MEDICARE | Source: Intra-hospital | Admitting: Student in an Organized Health Care Education/Training Program

## 2023-04-13 ENCOUNTER — Encounter
Admit: 2023-04-13 | Discharge: 2023-04-18 | Disposition: A | Discharge status: Skilled Nursing Facility | Payer: MEDICARE | Source: Intra-hospital | Admitting: Student in an Organized Health Care Education/Training Program

## 2023-04-13 ENCOUNTER — Encounter: Admit: 2023-04-13 | Discharge: 2023-04-18 | Payer: MEDICARE

## 2023-04-13 ENCOUNTER — Ambulatory Visit: Admit: 2023-04-13 | Discharge: 2023-04-18 | Payer: MEDICARE

## 2023-04-16 ENCOUNTER — Ambulatory Visit: Admit: 2023-04-16 | Payer: MEDICARE

## 2023-05-09 ENCOUNTER — Ambulatory Visit: Admit: 2023-05-09 | Payer: MEDICARE

## 2023-05-09 ENCOUNTER — Encounter: Admit: 2023-05-09 | Payer: MEDICARE

## 2023-05-09 ENCOUNTER — Encounter: Admit: 2023-05-09 | Payer: MEDICARE | Attending: Student in an Organized Health Care Education/Training Program

## 2023-05-09 ENCOUNTER — Ambulatory Visit
Admit: 2023-05-09 | Discharge: 2023-06-07 | Disposition: A | Discharge status: Hospice | Payer: MEDICARE | Source: Intra-hospital

## 2023-06-06 DIAGNOSIS — J449 Chronic obstructive pulmonary disease, unspecified: Principal | ICD-10-CM

## 2023-06-06 DIAGNOSIS — J471 Bronchiectasis with (acute) exacerbation: Principal | ICD-10-CM

## 2023-06-06 MED ORDER — MIRTAZAPINE 7.5 MG TABLET
ORAL_TABLET | Freq: Every evening | ORAL | 3 refills | 90.00000 days | Status: CN
Start: 2023-06-06 — End: 2023-07-06

## 2023-06-07 ENCOUNTER — Inpatient Hospital Stay: Admit: 2023-06-07 | Discharge: 2023-06-12 | Payer: MEDICARE

## 2023-06-07 ENCOUNTER — Encounter: Admit: 2023-06-07 | Discharge: 2023-06-12 | Payer: MEDICARE

## 2023-06-07 ENCOUNTER — Ambulatory Visit
Admit: 2023-06-07 | Discharge: 2023-07-05 | Disposition: E | Payer: MEDICARE | Source: Other Acute Inpatient Hospital | Admitting: Student in an Organized Health Care Education/Training Program

## 2023-06-07 MED ORDER — ONDANSETRON HCL (PF) 4 MG/2 ML INJECTION SOLUTION
Freq: Three times a day (TID) | INTRAVENOUS | 0 refills | 4 days | Status: SS | PRN
Start: 2023-06-07 — End: 2023-07-07

## 2023-06-07 MED ORDER — GUAIFENESIN 100 MG/5 ML ORAL LIQUID
Freq: Three times a day (TID) | ORAL | 0 refills | 4 days
Start: 2023-06-07 — End: ?

## 2023-06-07 MED ORDER — MIRTAZAPINE 7.5 MG TABLET
ORAL_TABLET | Freq: Every evening | ORAL | 0 refills | 30 days | Status: SS
Start: 2023-06-07 — End: 2023-07-07

## 2023-06-07 MED ORDER — OXYCODONE 5 MG/5 ML ORAL SOLUTION
ORAL | 0 refills | 1 days | Status: CN | PRN
Start: 2023-06-07 — End: 2023-06-12

## 2023-06-08 DIAGNOSIS — J441 Chronic obstructive pulmonary disease with (acute) exacerbation: Principal | ICD-10-CM

## 2023-06-08 DIAGNOSIS — J449 Chronic obstructive pulmonary disease, unspecified: Principal | ICD-10-CM

## 2023-06-19 DIAGNOSIS — J449 Chronic obstructive pulmonary disease, unspecified: Principal | ICD-10-CM

## 2023-06-19 DIAGNOSIS — J441 Chronic obstructive pulmonary disease with (acute) exacerbation: Principal | ICD-10-CM

## 2023-07-05 DEATH — deceased
